# Patient Record
Sex: Male | Born: 1935 | Race: White | Hispanic: No | Marital: Married
Health system: Midwestern US, Community
[De-identification: ages and names within clinical notes are randomized; demographics above are authoritative.]

## PROBLEM LIST (undated history)

## (undated) DIAGNOSIS — R413 Other amnesia: Secondary | ICD-10-CM

## (undated) DIAGNOSIS — R55 Syncope and collapse: Secondary | ICD-10-CM

## (undated) DIAGNOSIS — E871 Hypo-osmolality and hyponatremia: Secondary | ICD-10-CM

## (undated) DIAGNOSIS — R7989 Other specified abnormal findings of blood chemistry: Secondary | ICD-10-CM

## (undated) DIAGNOSIS — F0390 Unspecified dementia without behavioral disturbance: Secondary | ICD-10-CM

## (undated) DIAGNOSIS — N189 Chronic kidney disease, unspecified: Secondary | ICD-10-CM

## (undated) DIAGNOSIS — E785 Hyperlipidemia, unspecified: Secondary | ICD-10-CM

## (undated) DIAGNOSIS — I10 Essential (primary) hypertension: Secondary | ICD-10-CM

## (undated) DIAGNOSIS — K219 Gastro-esophageal reflux disease without esophagitis: Secondary | ICD-10-CM

## (undated) DIAGNOSIS — N400 Enlarged prostate without lower urinary tract symptoms: Secondary | ICD-10-CM

## (undated) HISTORY — DX: Hyperlipidemia, unspecified: E78.5

## (undated) HISTORY — DX: Benign prostatic hyperplasia without lower urinary tract symptoms: N40.0

## (undated) HISTORY — DX: Gastro-esophageal reflux disease without esophagitis: K21.9

## (undated) HISTORY — DX: Chronic kidney disease, unspecified: N18.9

## (undated) HISTORY — PX: TRANSURETHRAL RESECTION OF PROSTATE: SHX73

## (undated) HISTORY — DX: Essential (primary) hypertension: I10

## (undated) HISTORY — DX: Unspecified dementia, unspecified severity, without behavioral disturbance, psychotic disturbance, mood disturbance, and anxiety: F03.90

---

## 2011-06-11 NOTE — Progress Notes (Signed)
Thomas Rodgers  DOB: 02/14/1935    CHIEF COMPLAINT:  Chief Complaint   Patient presents with   ??? Hypertension         SUBJECTIVE:  The patient comes in accompanied by his wife for followup of his multiple medical problems.  We had seen him a few months ago and found him to have a low platelet count and white blood cell count, referred him to the hematologist.Their feeling was the findings were nonspecific.  they plan on seeing him back again in January.  His liver function tests remain considerably elevated, the hematologist makes note of the fact that the patient had an abdominal CT scan done in July which revealed a nonspecific fatty liver.  He    He sees Dr. Elder Cyphers for GERD, underwent EGD this past spring and a stricture was found which was dilated.  Around about that time he also saw Dr. Ludwig Clarks in Dr. Vevelyn Francois absence who noted the abnormal liver function tests and recommended that this be followed up.    ROS:    He is well, remains physically active, plays golf.  He continues to drink alcohol in spite of everyone's protestations, he says he limits it to 2 glasses of wine a day.  There's been no indigestion or heartburn weight loss chills fever.  Comprehensive ROS otherwise negative.     PMFSH:    Past Surgical History   Procedure Date   ??? Hx other surgical      esophageal stricture- stretched twice   ??? Hx turp 1997     History     Social History   ??? Marital Status: Married     Spouse Name: N/A     Number of Children: N/A   ??? Years of Education: N/A     Social History Main Topics   ??? Smoking status: Former Smoker   ??? Smokeless tobacco: Not on file    Comment: quit cigs 1972   ??? Alcohol Use: 0.0 oz/week     3-4 Glasses of wine per week      03/05/2007-nonex 1 month   ??? Drug Use:    ??? Sexually Active:      Other Topics Concern   ??? Not on file     Social History Narrative   ??? No narrative on file         LABORATORY STUDIES:        PHYSICAL EXAM:     BP 124/76   Pulse 102   Ht 5' 9.4" (1.763 m)   Wt 185 lb (83.915 kg)   BMI 27.01 kg/m2    There is no peripheral edema.  Lungs are clear to auscultation and percussion and cardiac rhythm shows sinus rhythm without gallop or murmur.    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  2011/06/24    Overview       Signed 06/24/11  2:57 PM by Tyson Alias, MD     Dilated by Dr Waynette Buttery March 2012          Abnormal LFTs 790.6  06-24-2011    Overview       Signed 2011-06-24  3:04 PM by Tyson Alias, MD     Probably related to alcohol          Dementia 294.20  03/28/2011    Overview       Signed  03/28/2011 12:06 PM by Drema Pry Moultrie     Probable Alzheimers          Hypertension 401.9  03/28/2011    Overview       Signed 03/28/2011 12:07 PM by Drema Pry Moultrie     Intolerant 40mg  lisinopril          Metabolic syndrome 277.7  03/28/2011    Overview       Signed 03/28/2011 12:09 PM by Drema Pry Moultrie     Fatty liver          Hyperlipidemia 272.4  03/28/2011    Overview       Addendum 03/28/2011 12:09 PM by Drema Pry Moultrie     High HDL  Framingham risk score of 7%  PosItive Calcium Score of 469; negative stress test          Gout 274.9  03/28/2011        Pancytopenia 284.19  03/28/2011    Overview       Signed 06/11/2011  3:22 PM by Tyson Alias, MD     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 Gerarda Fraction          Peptic ulcer 533.90  03/28/2011    Overview       Signed 03/28/2011 12:11 PM by Erie Noe     Dr.Grier                  DISPOSITION:   The patient's CBC also shows a very mildly decreased hematocrit, which puts him into the class of a possible pancytopenia.  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  Dr. Ludwig Clarks had wanted him to stop drinking for a few weeks and then repeat his liver function tests.  The patient failed to do this.  On numerous times in the past I have recommended to him that he stop drinking completely, because of his dementia.  He has never stopped.    I talked to him and his wife this time about leaving alcohol off completely for 2-1/2 weeks and let us recheck his liver panel.  He says quite agreeably that he will absolutely do this and his wife shakes her head yes also.  However I have had this experience with him on multiple occasions before with the same problems as were made and failed to be kept.  I am therefore dubious that he is able to do this.However we will try.  He is to stop drinking immediately, and returned on December 20 for repeat liver panel.  We will also check a protime on him at that time.        Follow-up Disposition:  Return in about 3 months (around 09/09/2011).    Tyson Alias, MD  06/11/2011

## 2011-06-25 LAB — AMB POC PT/INR: INR POC: 1

## 2011-06-25 LAB — CBC WITH AUTOMATED DIFF

## 2011-09-03 MED ORDER — LISINOPRIL 10 MG TAB
10 mg | ORAL_TABLET | Freq: Every day | ORAL | Status: DC
Start: 2011-09-03 — End: 2012-03-11

## 2011-09-07 LAB — CBC WITH AUTOMATED DIFF

## 2011-09-13 NOTE — Progress Notes (Signed)
Tyson Alias, M.D.  [facility]  [place], [place] [address]  09/13/2011 3:30 PM        [name]  DOB: Jul 03, 1935    CHIEF COMPLAINT:  Chief Complaint   Patient presents with   ??? Hypertension     The patient comes in accompanied by his wife in followup of his multiple medical problems.  He is now seeing a nephrologist Dr. Enid Baas and his notes are reviewed.  He plans on probably doing a bone marrow biopsy in the near future if the patient's labs continued to be abnormal.    The patient feels well and has no weights.  Unfortunately he continues to drink at least 2 glasses of wine a night.  He is not having any trouble voiding, gout as been under good control, no indigestion or heartburn.He denies drinking excess fluids.    ROS:    He's had no chest pain, shortness of breath chills fever or weight loss.  He fell in the shower about 2 weeks ago, got lightheaded, injured his shoulder a little bit but it was x-rayed and that was okay and he is fine now.  He has had no more spells of lightheadedness and no more falls.  He continues to play golf and walks, does not Ride acart. [place] Kitchen  He drives, has not had any tickets or accidents.  Comprehensive ROS otherwise negative.     PMFSH:  Past Surgical History   Procedure Date   ??? Hx other surgical      esophageal stricture- stretched twice   ??? Hx turp 1997   ??? Hx colonoscopy 2009     repeat 5 years     History     Social History   ??? Marital Status: MARRIED     Spouse Name: N/A     Number of Children: N/A   ??? Years of Education: N/A     Social History Main Topics   ??? Smoking status: Former Smoker   ??? Smokeless tobacco: Not on file    Comment: quit cigs 1972   ??? Alcohol Use: 7.0 oz/week     14 Glasses of wine per week      03/05/2007-nonex 1 month   ??? Drug Use: Not on file   ??? Sexually Active: Not on file     Other Topics Concern   ??? Not on file     Social History Narrative   ??? No narrative on file       CURRENT MEDICATIONS:  Current Outpatient  Prescriptions   Medication Sig Dispense Refill   ??? lisinopril (PRINIVIL, ZESTRIL) 10 mg tablet Take 1 Tab by mouth daily.  90 Tab  3   ??? omeprazole (PRILOSEC) 20 mg capsule Take 20 mg by mouth two (2) times a day.         ??? Rivastigmine (EXELON) 9.5 mg/24 hour patch 1 Patch by TransDERmal route daily.         ??? memantine (NAMENDA) 10 mg tablet Take  by mouth two (2) times a day.         ??? folic acid-vit b6-vit b12 (FOLTX) 2.5-25-2 mg tablet Take 1 Tab by mouth daily.         ??? cyanocobalamin (VITAMIN B-12) 1,000 mcg Subl by SubLINGual route.         ??? multivitamins-minerals-lutein (CENTRUM SILVER) Tab Take  by mouth.  LABORATORY STUDIES:  Laboratory from last week shows a normal white blood cell count now with a fairly normal differential, platelet count is 134,000 which is improved and hemoglobin is 14.7, LDL cholesterol is 115.  Liver panel continues to be abnormal with an SGPT of 149, SGOT of 192 but normal alkaline phosphatase.  Sodium is now down to 131, it has been as low as 132 back 2 years ago.  Last August he was 134.      PHYSICAL EXAM:  BP 122/75   Pulse 87   Ht 5\' 9"  (1.753 m)   Wt 189 lb (85.73 kg)   BMI 27.91 kg/m2  Cardiac exam is sinus without any gallop or murmur.  There is no peripheral edema.  He is neatly dressed.    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  07/10/11    Overview       Signed 07-10-2011  2:57 PM by Tyson Alias, MD     Dilated by Dr Waynette Buttery March 2012          Abnormal LFTs 790.6  July 10, 2011    Overview       Signed 2011-07-10  3:04 PM by Tyson Alias, MD     Probably related to alcohol          Dementia 294.20  03/28/2011    Overview       Addendum 09/13/2011  3:15 PM by Tyson Alias, MD     MMSE 22/30 with abnormal clock Aug 2012, Probable Alzheimers vs Korsikoff's          Hypertension 401.9  03/28/2011    Overview       Signed 03/28/2011 12:07 PM by Drema Pry Moultrie     Intolerant  40mg  lisinopril          Metabolic syndrome 277.7  03/28/2011    Overview       Signed 03/28/2011 12:09 PM by Drema Pry Moultrie     Fatty liver          Hyperlipidemia 272.4  03/28/2011    Overview       Addendum 03/28/2011 12:09 PM by Drema Pry Moultrie     High HDL  Framingham risk score of 7%  PosItive Calcium Score of 469; negative stress test          Gout 274.9  03/28/2011        Pancytopenia 284.19  03/28/2011    Overview       Addendum 09/13/2011  3:17 PM by Tyson Alias, MD     Dr Enid Baas thinks ? 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 Gerarda Fraction          Peptic ulcer 533.90  03/28/2011    Overview       Signed 03/28/2011 12:11 PM by Erie Noe     Dr.Grier          Hyponatremia 276.1  09/13/2011              DISPOSITION:  Is not quite clear why he has mild hyponatremia.  His abnormal liver function tests for work  done in June 2011 [facility].  It was noted that he had had abnormal liver tests dating back as far back as 2001  Thought related to obesity and fatty liver.  Hepatitis A, B, and C were checked in 2011 and were negative.  He was seen by the nurse practitioner at that time arrange for made for him to come back and see Dr. Waynette Buttery, his regular gastroenterologist and I'm not sure that that ever happened.    I am going to recommend to him that he limit his fluids.  We will go ahead and make an appointment to go back and see Dr. Waynette Buttery to just make sure that Dr Waynette Buttery does not feel that he needs a liver biopsy.He will be seeing Dr. Earnstine Regal back in June    Follow-up Disposition: Not on File    Tyson Alias, MD  09/13/2011      Elements of this note have been dictated using speech recognition software.  As a result, errors of speech recognition may have occurred. Please excuse.

## 2011-11-15 ENCOUNTER — Encounter

## 2011-11-22 LAB — AMB POC PT/INR: INR POC: 1

## 2011-12-04 NOTE — Progress Notes (Signed)
Thomas Rodgers, M.D.  [facility]  [place], [place] [address]  12/04/2011 9:48 AM      Thomas Rodgers  DOB: 19-Feb-1935    CHIEF COMPLAINT:  Chief Complaint   Patient presents with   ??? Hypertension      The patient comes back in to followup on his hypertension.  He states that he did see the gastroenterologist about his liver disease but we do not have a report from them.  We will try to obtain that report.  When he last saw the hematologist, reportedly his blood counts were improved and they decided not to do a bone marrow biopsy.   He has not had any trouble from his gout.  His wife states that they have not been checking his blood pressure at home even though they have a machine.    CURRENT MEDICATIONS:  Current Outpatient Prescriptions   Medication Sig Dispense Refill   ??? lisinopril (PRINIVIL, ZESTRIL) 10 mg tablet Take 1 Tab by mouth daily.  90 Tab  3   ??? omeprazole (PRILOSEC) 20 mg capsule Take 20 mg by mouth two (2) times a day.         ??? Rivastigmine (EXELON) 9.5 mg/24 hour patch 1 Patch by TransDERmal route daily.         ??? memantine (NAMENDA) 10 mg tablet Take  by mouth two (2) times a day.         ??? folic acid-vit b6-vit b12 (FOLTX) 2.5-25-2 mg tablet Take 1 Tab by mouth daily.         ??? cyanocobalamin (VITAMIN B-12) 1,000 mcg Subl by SubLINGual route.         ??? multivitamins-minerals-lutein (CENTRUM SILVER) Tab Take  by mouth.               LABORATORY STUDIES:  Recent lab work from last week is reviewed, his sodium is still a little low but slightly improved up to 132 with otherwise normal electrolytes and a creatinine of 1.0, amylase and lipase are both normal and LDL cholesterol was 110 with an HDL of 105.    PHYSICAL EXAM:  BP 132/70   Pulse 66   Wt 186 lb (84.369 kg)  Multiple blood pressure checks are all within normal limits.  I did this in both arms manually.  There is no significant discrepancy between arms.    DIAGNOSIS:  1. Hypertension  METABOLIC PANEL, BASIC   2.  Metabolic syndrome  METABOLIC PANEL, BASIC   3. Abnormal LFTs  HEPATIC FUNCTION PANEL   4. Alcoholism     5. Hyperlipidemia  LIPID PANEL, HEPATIC FUNCTION PANEL   6. Gout  URIC ACID   7. Pancytopenia  CBC WITH AUTOMATED DIFF   8. Hyponatremia  METABOLIC PANEL, BASIC   9. Peptic ulcer     10. Chronic liver disease  AMB POC PT/INR       DISPOSITION:  I think his blood pressure is under reasonable control.  We will continue his current plan of care but try to obtain the records from the gastroenterologist.      Orders Placed This Encounter   ??? METABOLIC PANEL, BASIC   ??? CBC WITH AUTOMATED DIFF   ??? LIPID PANEL   ??? HEPATIC FUNCTION PANEL   ??? URIC ACID   ??? AMB POC PT/INR     Follow-up Disposition:  Return in about 3 months (around 03/05/2012).  Thomas Alias, MD  12/04/2011  Elements of this note have been dictated using speech recognition software.  As a result, errors of speech recognition may have occurred. Please excuse.

## 2012-03-06 LAB — METABOLIC PANEL, BASIC
BUN: 13 mg/dl (ref 9–23)
CO2: 27 mmol/L (ref 20–32)
Calcium: 10 mg/dl (ref 8.4–10.5)
Chloride: 92 mmol/L — ABNORMAL LOW (ref 97–111)
Creatinine: 1 mg/dL (ref 0.7–1.5)
GFR, estimated: 80.18
Glucose: 91 mg/dl (ref 65–100)
Potassium: 4.9 mmol/L (ref 3.5–5.5)
Sodium: 131 mmol/L — ABNORMAL LOW (ref 135–148)

## 2012-03-06 LAB — HEPATIC FUNCTION PANEL
ALT (SGPT): 67 IU/L — ABNORMAL HIGH (ref 10–35)
AST (SGOT): 90 IU/L — ABNORMAL HIGH (ref 16–40)
Albumin: 4.9 g/dl (ref 3.2–5.0)
Alk. phosphatase: 56 U/L (ref 31–130)
Bilirubin, direct: 0.5 mg/dl — ABNORMAL HIGH (ref 0.0–0.4)
Bilirubin, total: 2.3 mg/dl — ABNORMAL HIGH (ref 0.4–1.4)
Protein, total: 7.7 g/dl (ref 6.0–8.5)

## 2012-03-06 LAB — CBC WITH AUTOMATED DIFF
ABS. GRANULOCYTES: 2.3 10*3/uL (ref 2.0–7.8)
ABS. LYMPHOCYTES: 0.6 10*3/uL — ABNORMAL LOW (ref 2.0–7.8)
ABS. MONOCYTES: 0.2 (ref 0.10–1.08)
GRANULOCYTES: 74 % (ref 37.0–92.0)
HCT: 44.7 % (ref 41.0–54.0)
HGB: 15.1 g/dL (ref 14.0–18.0)
LYMPHOCYTES: 19.8 % — ABNORMAL LOW (ref 20.5–51.1)
MCH: 32.8 pg (ref 27.0–34.0)
MCHC: 33.8 g/dL (ref 31.0–36.0)
MCV: 96.9 fL (ref 80.0–100.0)
MEAN PLATELET VOLUME: 7.6 fL
MONOCYTES: 6.2 (ref 3.0–10.0)
PLATELET: 114 10*3/uL — ABNORMAL LOW (ref 140–440)
RBC: 4.61 10*3/uL (ref 4.40–6.20)
RDW: 13 %
WBC: 3.1 10*3/uL — ABNORMAL LOW (ref 4.0–11.0)

## 2012-03-06 LAB — LIPID PANEL
CHOL/HDL Ratio: 2.2 (ref 0.0–6.7)
Cholesterol, total: 249 mg/dl — ABNORMAL HIGH (ref 0–200)
HDL Cholesterol: 111 mg/dl (ref >40)
LDL, calculated: 124 mg/dL (ref 0–130)
Triglyceride: 68 mg/dl (ref 0–200)
VLDL, calculated: 14 mg/dL (ref 0–40)

## 2012-03-06 LAB — URIC ACID: Uric acid: 7.3 mg/dl — ABNORMAL HIGH (ref 2.4–7.0)

## 2012-03-06 LAB — PROTHROMBIN TIME + INR: INR: 1

## 2012-03-11 NOTE — Progress Notes (Signed)
Thomas Rodgers, M.D.  [facility]  [place], [place] [address]  03/11/2012 11:40 AM        Thomas Rodgers  DOB: [birth year]    CHIEF COMPLAINT:  Chief Complaint   Patient presents with   ??? Hypertension     3 mos rov--refil meds     The patient comes in to followup on his multiple medical problems.  He is accompanied by his wife.  Unfortunately he continues to drink.  He continues to be active, playing golf.  He drives, has had no tickets or accidents and he has no complaints.  His memory appears to have been stable.    ROS:    He has not had any chest pain shortness of breath, dizzy spells, no evidence of bleeding  Comprehensive ROS otherwise negative.     PMFSH:  Past Surgical History   Procedure Date   ??? Hx other surgical      esophageal stricture- stretched twice   ??? Hx turp 1997   ??? Hx colonoscopy 2009     repeat 5 years     History     Social History   ??? Marital Status: MARRIED     Spouse Name: N/A     Number of Children: N/A   ??? Years of Education: N/A     Social History Main Topics   ??? Smoking status: Former Smoker   ??? Smokeless tobacco: Not on file    Comment: quit cigs 1972   ??? Alcohol Use: 7.0 oz/week     14 Glasses of wine per week      03/05/2007-nonex 1 month   ??? Drug Use: Not on file   ??? Sexually Active: Not on file     Other Topics Concern   ??? Not on file     Social History Narrative   ??? No narrative on file       CURRENT MEDICATIONS:  Current Outpatient Prescriptions   Medication Sig Dispense Refill   ??? lisinopril (PRINIVIL, ZESTRIL) 10 mg tablet Take 1 Tab by mouth daily.  90 Tab  3   ??? omeprazole (PRILOSEC) 20 mg capsule Take 1 Cap by mouth two (2) times a day.  180 Cap  3   ??? rivastigmine (EXELON) 9.5 mg/24 hour patch 1 Patch by TransDERmal route daily.  90 Patch  3   ??? memantine (NAMENDA) 10 mg tablet Take 1 Tab by mouth two (2) times a day.  180 Tab  3   ??? folic acid-vit b6-vit b12 (FOLTX) 2.5-25-2 mg tablet Take 1 Tab by mouth daily.  90 Tab  3   ??? cyanocobalamin  (VITAMIN B-12) 1,000 mcg Subl by SubLINGual route.         ??? multivitamins-minerals-lutein (CENTRUM SILVER) Tab Take  by mouth.               LABORATORY STUDIES:  Laboratory data from last week is reviewed, his liver function tests are somewhat improved with a normal alkaline phosphatase and ALT and AST are 67 and 90 respectively.Uric acid is 7.3 although his gout remains asymptomatic.  Platelet count is 114,000 with a white count of 3100, these are pretty stable numbers.  Hemoglobin is normal at 15.1.  Total cholesterol 249 with an HDL of 111 and LDL of 124, sodium 131 with a potassium of 4.9 and creatinine of 1.0.        PHYSICAL EXAM:  BP 132/76   Pulse 79  Wt 184 lb (83.462 kg)  Cardiac exam is sinus without any gallop or murmur.  Simple conversation is appropriate.  He is friendly but has very poor short-term memory.    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  06/11/2011    Overview    Signed 06/11/2011  2:57 PM by Thomas Alias, MD     Dilated by Dr Waynette Buttery March 2012          Abnormal LFTs 790.6  06/11/2011    Overview    Signed 06/11/2011  3:04 PM by Thomas Alias, MD     Probably related to alcohol          Dementia 294.20  03/28/2011    Overview    Addendum 09/13/2011  3:15 PM by Thomas Alias, MD     MMSE 22/30 with abnormal clock Aug 2012, Probable Alzheimers vs Korsikoff's          Hypertension 401.9  03/28/2011    Overview    Signed 03/28/2011 12:07 PM by Drema Pry Moultrie     Intolerant 40mg  lisinopril          Metabolic syndrome 277.7  03/28/2011    Overview    Signed 03/28/2011 12:09 PM by Drema Pry Moultrie     Fatty liver          Hyperlipidemia 272.4  03/28/2011    Overview    Addendum 03/28/2011 12:09 PM by Drema Pry Moultrie     High HDL  Framingham risk score of 7%  PosItive Calcium Score of 469; negative stress test          Gout 274.9  03/28/2011        Pancytopenia 284.19  03/28/2011    Overview    Addendum 09/13/2011  3:17 PM  by Thomas Alias, MD     Dr Enid Baas thinks ? 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 Gerarda Fraction          Peptic ulcer 533.90  03/28/2011    Overview    Signed 03/28/2011 12:11 PM by Erie Noe     Dr.Grier          Chronic liver disease 571.9  12/04/2011        Hyponatremia 276.1  09/13/2011              DISPOSITION:  His gout remains asymptomatic, dementia appears to be stable, he continues to drink.  His hypertension as under reasonable control and his metabolic syndrome shows normal blood sugars at this time,, hyperlipidemia is under reasonable control, he has a low Framingham risk score because of his very high HDL, pancytopenia remains stable.    Follow-up Disposition:  Return in about 6 months (around 09/08/2012).  Orders Placed This Encounter   ??? INFLUENZA VIRUS VACCINE, FLUVIRIN VACC, 3 YRS & >, IM, MEDICARE ONLY   ??? METABOLIC PANEL, BASIC   ??? CBC WITH AUTOMATED DIFF   ??? LIPID PANEL   ??? HEPATIC FUNCTION PANEL   ??? PROTHROMBIN TIME   ??? URIC ACID   ??? lisinopril (PRINIVIL, ZESTRIL) 10 mg tablet   ???  omeprazole (PRILOSEC) 20 mg capsule   ??? rivastigmine (EXELON) 9.5 mg/24 hour patch   ??? memantine (NAMENDA) 10 mg tablet   ??? folic acid-vit b6-vit b12 (FOLTX) 2.5-25-2 mg tablet     Thomas Alias, MD  03/11/2012      Elements of this note have been dictated using speech recognition software.  As a result, errors of speech recognition may have occurred. Please excuse.

## 2012-09-23 LAB — METABOLIC PANEL, BASIC
BUN: 8 mg/dl — ABNORMAL LOW (ref 9–23)
CO2: 27 mmol/L (ref 20–32)
Calcium: 9.6 mg/dl (ref 8.4–10.5)
Chloride: 96 mmol/L — ABNORMAL LOW (ref 97–111)
Creatinine: 1 mg/dL (ref 0.7–1.5)
GFR est AA: 99.39 mL/min (ref >60.00)
GFR est non-AA: 82.01 mL/min (ref >60.00)
Glucose: 85 mg/dl (ref 65–100)
Potassium: 4.5 mmol/L (ref 3.5–5.5)
Sodium: 133 mmol/L — ABNORMAL LOW (ref 135–148)

## 2012-09-23 LAB — CBC WITH AUTOMATED DIFF
ABS. GRANULOCYTES: 1.7 10*3/uL — ABNORMAL LOW (ref 2.0–7.8)
ABS. LYMPHOCYTES: 0.8 10*3/uL — ABNORMAL LOW (ref 2.0–7.8)
ABS. MONOCYTES: 0.1 (ref 0.10–1.08)
GRANULOCYTES: 65.6 % (ref 37.0–92.0)
HCT: 43 % (ref 41.0–54.0)
HGB: 14.7 g/dL (ref 14.0–18.0)
LYMPHOCYTES: 30.6 % (ref 20.5–51.1)
MCH: 33.3 pg (ref 27.0–34.0)
MCHC: 34.2 g/dL (ref 31.0–36.0)
MCV: 97.5 fL (ref 80.0–100.0)
MEAN PLATELET VOLUME: 7.9 fL
MONOCYTES: 3.8 (ref 3.0–10.0)
PLATELET: 134 10*3/uL — ABNORMAL LOW (ref 140–440)
RBC: 4.41 10*3/uL (ref 4.40–6.20)
RDW: 12.6 %
WBC: 2.6 10*3/uL — ABNORMAL LOW (ref 4.0–11.0)

## 2012-09-23 LAB — HEPATIC FUNCTION PANEL
ALT (SGPT): 73 IU/L — ABNORMAL HIGH (ref 10–35)
AST (SGOT): 104 IU/L — ABNORMAL HIGH (ref 16–40)
Albumin: 4.5 g/dl (ref 3.2–5.0)
Alk. phosphatase: 54 U/L (ref 31–130)
Bilirubin, direct: 0.3 mg/dl (ref 0.0–0.4)
Bilirubin, total: 1.5 mg/dl — ABNORMAL HIGH (ref 0.4–1.4)
Protein, total: 7.3 g/dl (ref 6.0–8.5)

## 2012-09-23 LAB — LIPID PANEL
CHOL/HDL Ratio: 2.4 (ref 0.0–6.7)
Cholesterol, total: 241 mg/dl — ABNORMAL HIGH (ref 0–200)
HDL Cholesterol: 100 mg/dl (ref >40)
LDL, calculated: 117 mg/dL (ref 0–130)
Triglyceride: 123 mg/dl (ref 0–200)
VLDL, calculated: 25 mg/dL (ref 0–40)

## 2012-09-23 LAB — URIC ACID: Uric acid: 7 mg/dl (ref 2.4–7.0)

## 2012-09-23 LAB — PROTHROMBIN TIME + INR: INR: 1

## 2012-09-30 NOTE — Progress Notes (Signed)
Thomas Rodgers, M.D.  [facility]  [place], [place] [address]  09/30/2012 9:45 AM        Thomas Rodgers  DOB: 11-11-1934    CHIEF COMPLAINT:  Chief Complaint   Patient presents with   ??? Cholesterol Problem     rov   ??? Gout   ??? Hypertension   ??? GERD   ??? Benign Prostatic Hypertrophy     The  patient is brought in by his wife for routine followup of his multiple medical problems. He continues to be quite physically active, continues to play golf, usually walking rather than riding a cart.  He drives, has not had any tickets or accidents.  He walks without the use of any assistive devices and has had no falls.  He continues to drink 2 glasses of wine a day.  He has been seeing the hematologist on a regular basis and they also have been monitoring his platelet count and white blood cell count.  His wife thinks his dementia has gotten low but worse but there have been no behavioral problems et Karie Soda.    ROS:    He has not been having any headache or chest pain, no shortness of breath, no difficulty swallowing.  He said no dysphagia, food is not having, no indigestion or heartburn.  He's had no problems from his gout  Comprehensive ROS otherwise negative.     PMFSH:  Past Surgical History   Procedure Laterality Date   ??? Hx other surgical       esophageal stricture- stretched twice   ??? Hx turp  1997   ??? Hx colonoscopy  2009     repeat 5 years     History     Social History   ??? Marital Status: MARRIED     Spouse Name: N/A     Number of Children: N/A   ??? Years of Education: N/A     Social History Main Topics   ??? Smoking status: Former Smoker   ??? Smokeless tobacco: Not on file      Comment: quit cigs 1972   ??? Alcohol Use: 7.0 oz/week     14 Glasses of wine per week      Comment: 03/05/2007-nonex 1 month   ??? Drug Use: Not on file   ??? Sexually Active: Not on file     Other Topics Concern   ??? Not on file     Social History Narrative   ??? No narrative on file       CURRENT MEDICATIONS:  Current  Outpatient Prescriptions   Medication Sig Dispense Refill   ??? lisinopril (PRINIVIL, ZESTRIL) 10 mg tablet Take 1 Tab by mouth daily.  90 Tab  3   ??? omeprazole (PRILOSEC) 20 mg capsule Take 1 Cap by mouth two (2) times a day.  180 Cap  3   ??? rivastigmine (EXELON) 9.5 mg/24 hour patch 1 Patch by TransDERmal route daily.  90 Patch  3   ??? memantine (NAMENDA) 10 mg tablet Take 1 Tab by mouth two (2) times a day.  180 Tab  3   ??? folic acid-vit b6-vit b12 (FOLTX) 2.5-25-2 mg tablet Take 1 Tab by mouth daily.  90 Tab  3   ??? cyanocobalamin (VITAMIN B-12) 1,000 mcg Subl by SubLINGual route.         ??? multivitamins-minerals-lutein (CENTRUM SILVER) Tab Take  by mouth.  LABORATORY STUDIES:  Laboratory data from last week is reviewed, white count is 2600 with a normal differential, platelet count is 134,000.  Sodium is 133 with a creatinine of 1.0 and a potassium of 4.5, chloride of 96.  Blood sugar 95, ALT is 73, AST is 104, alkaline phosphatase is normal at 54.  LDL cholesterol 117 with a triglyceride of 123 and an HDL of 100.  Uric acid 7.0      PHYSICAL EXAM:  BP 129/70   Pulse 75   Ht 5\' 9"  (1.753 m)   Wt 180 lb (81.647 kg)   BMI 26.57 kg/m2  MMSE is performed and he scores 23 points which is one point there is any score about a year and a half ago.  He draws a slightly abnormal clock.  He is alert, neatly dressed, cooperative and pleasant.    No results found for any visits on 09/30/12.      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  06/11/2011    Overview    Signed 06/11/2011  2:57 PM by Thomas Alias, MD      Dilated by Dr Waynette Buttery March 2012          Abnormal LFTs 790.6  06/11/2011    Overview    Signed 06/11/2011  3:04 PM by Thomas Alias, MD      Probably related to alcohol          Dementia 294.20  03/28/2011    Overview    Addendum 09/30/2012  9:43 AM by Thomas Alias, MD      MMSE 22/30 with abnormal clock Aug 2012, 23/06 October 2011 with abnormal clock,  Alzheimers vs Korsikoff's          Hypertension 401.9  03/28/2011    Overview    Signed 03/28/2011 12:07 PM by Drema Pry Moultrie      Intolerant 40mg  lisinopril          Metabolic syndrome 277.7  03/28/2011    Overview    Signed 03/28/2011 12:09 PM by Drema Pry Moultrie      Fatty liver          Hyperlipidemia 272.4  03/28/2011    Overview    Addendum 03/28/2011 12:09 PM by Drema Pry Moultrie      High HDL  Framingham risk score of 7%  PosItive Calcium Score of 469; negative stress test          Gout 274.9  03/28/2011        Pancytopenia 284.19  03/28/2011    Overview    Addendum 09/13/2011  3:17 PM by Thomas Alias, MD      Dr Enid Baas thinks ? Fatty liver, I suspect hypersplenism from suspected alcoholic cirrhosis          BPH (benign prostatic hyperplasia) 600.90  03/28/2011    Overview    Signed 03/28/2011 12:10 PM by Drema Pry Moultrie      Urethral stricture; Dr. Gerarda Fraction          Peptic ulcer 533.90  03/28/2011    Overview    Signed 03/28/2011 12:11 PM by Erie Noe      Dr.Grier          Chronic liver disease 571.9  12/04/2011        Hyponatremia 276.1  09/13/2011  DISPOSITION:  His dementia appears to be fairly stable which would suggest that this dementia may be more related to alcoholism rather than to Alzheimer's but we will continue to monitor it.  Hypertension is under reasonable control and his metabolic syndrome is stable, GERD is asymptomatic, hyperlipidemia is under reasonable control and his gout remains asymptomatic.  Pancytopenia continues to be followed by the hematologist oncologist    Follow-up Disposition:  Return in about 6 months (around 04/02/2013).  Orders Placed This Encounter   ??? VITAMIN B12   ??? METABOLIC PANEL, BASIC   ??? CBC WITH AUTOMATED DIFF   ??? LIPID PANEL   ??? URIC ACID   ??? HEPATIC FUNCTION PANEL   ??? lisinopril (PRINIVIL, ZESTRIL) 10 mg tablet   ??? omeprazole (PRILOSEC) 20 mg capsule   ??? rivastigmine (EXELON) 9.5 mg/24 hour patch   ??? memantine (NAMENDA) 10 mg  tablet     Thomas Alias, MD  09/30/2012      Elements of this note have been dictated using speech recognition software.  As a result, errors of speech recognition may have occurred. Please excuse.

## 2013-03-17 LAB — CBC WITH AUTOMATED DIFF
ABS. GRANULOCYTES: 1.6 10*3/uL — ABNORMAL LOW (ref 2.0–7.8)
ABS. LYMPHOCYTES: 0.9 10*3/uL — ABNORMAL LOW (ref 2.0–7.8)
ABS. MONOCYTES: 0.2 (ref 0.10–1.08)
GRANULOCYTES: 60.9 % (ref 37.0–92.0)
HCT: 43.1 % (ref 41.0–54.0)
HGB: 14.8 g/dL (ref 14.0–18.0)
LYMPHOCYTES: 32 % (ref 20.5–51.1)
MCH: 33.2 pg (ref 27.0–34.0)
MCHC: 34.3 g/dL (ref 31.0–36.0)
MCV: 96.8 fL (ref 80.0–100.0)
MEAN PLATELET VOLUME: 7.5 fL
MONOCYTES: 7.1 (ref 3.0–10.0)
PLATELET: 152 10*3/uL (ref 140–440)
RBC: 4.45 10*3/uL (ref 4.40–6.20)
RDW: 12.4 %
WBC: 2.7 10*3/uL — ABNORMAL LOW (ref 4.0–11.0)

## 2013-03-17 LAB — LIPID PANEL
CHOL/HDL Ratio: 2.3 (ref 0.0–6.7)
Cholesterol, total: 230 mg/dl — ABNORMAL HIGH (ref 0–200)
HDL Cholesterol: 101 mg/dl (ref >40)
LDL, calculated: 106 mg/dL (ref 0–130)
Triglyceride: 113 mg/dl (ref 0–200)
VLDL, calculated: 23 mg/dL (ref 0–40)

## 2013-03-17 LAB — VITAMIN B12: Vitamin B12: 1273 pg/mL — ABNORMAL HIGH (ref 180–914)

## 2013-03-17 LAB — HEPATIC FUNCTION PANEL
ALT (SGPT): 66 IU/L — ABNORMAL HIGH (ref 10–35)
AST (SGOT): 86 IU/L — ABNORMAL HIGH (ref 16–40)
Albumin: 4.7 g/dl (ref 3.2–5.0)
Alk. phosphatase: 56 U/L (ref 31–130)
Bilirubin, direct: 0.3 mg/dl (ref 0.0–0.4)
Bilirubin, total: 1.2 mg/dl (ref 0.4–1.4)
Protein, total: 7.1 g/dl (ref 6.0–8.5)

## 2013-03-17 LAB — METABOLIC PANEL, BASIC
BUN: 10 mg/dl (ref 9–23)
CO2: 24 mmol/L (ref 20–32)
Calcium: 9.3 mg/dl (ref 8.4–10.5)
Chloride: 94 mmol/L — ABNORMAL LOW (ref 97–111)
Creatinine: 1.1 mg/dL (ref 0.7–1.5)
GFR est AA: 88.44 mL/min (ref >60.00)
GFR est non-AA: 72.97 mL/min (ref >60.00)
Glucose: 89 mg/dl (ref 65–100)
Potassium: 4.6 mmol/L (ref 3.5–5.5)
Sodium: 134 mmol/L — ABNORMAL LOW (ref 135–148)

## 2013-03-17 LAB — URIC ACID: Uric acid: 6.7 mg/dl (ref 2.4–7.0)

## 2013-03-17 NOTE — Telephone Encounter (Signed)
Message copied by Dow Adolph on Tue Mar 17, 2013  4:14 PM  ------       Message from: Tyson Alias       Created: Tue Mar 17, 2013  2:21 PM         Notify him sodium is a little lower than it was last time, make sure he is not drinking too much liquids, He should have an appointment to see me within about a week or 2 and if he does not, let me know  ------

## 2013-03-17 NOTE — Telephone Encounter (Signed)
Patient's wife Truddie Hidden notified of results and recommendations.

## 2013-03-26 NOTE — Progress Notes (Signed)
Tyson Alias, M.D.  [facility]  [place], [place] [address]  03/26/2013 11:02 AM        Thomas Rodgers  DOB: 06/29/1935    CHIEF COMPLAINT:  Chief Complaint   Patient presents with   ??? Hypertension     6 mth rov     The patient comes in accompanied by his wife to followup on his multiple medical problems.  He has been doing well. He did have some urethral bleeding this summer and was seen by his urologist Dr. Sharin Mons and was found to have a urethral stricture.  He also had a mole removed which apparently at first was thought to be melanoma but then was found to be "pre-melanoma.  This was from his back.  In going through all of that, he had to stop playing golf for a while and when he started going back to playing golf recently,  He started developing pain in his right calf whenever he would walk.  He went to one of the local doctor's care and they did an ultrasound and ruled out DVT.  He has an appointment to see the orthopedist next week.  Otherwise he's been doing well    ROS:    He's had no headache or chest pain, shortness of breath or cough or dizzy spells, no difficulty urinating or swallowing, no indigestion or heartburn and no gout  Comprehensive ROS otherwise negative.     PMFSH:  Past Surgical History   Procedure Laterality Date   ??? Hx other surgical       esophageal stricture- stretched twice   ??? Hx turp  1997   ??? Hx colonoscopy  07/21/2007     repeat 5 years     History     Social History   ??? Marital Status: MARRIED     Spouse Name: N/A     Number of Children: N/A   ??? Years of Education: N/A     Social History Main Topics   ??? Smoking status: Former Smoker   ??? Smokeless tobacco: Not on file      Comment: quit cigs 1972   ??? Alcohol Use: 7.0 oz/week     14 Glasses of wine per week      Comment: 03/05/2007-nonex 1 month   ??? Drug Use: Not on file   ??? Sexually Active: Not on file     Other Topics Concern   ??? Not on file     Social History Narrative   ??? No narrative on file        CURRENT MEDICATIONS:  Current Outpatient Prescriptions   Medication Sig Dispense Refill   ??? folic acid-vit b6-vit b12 (FOLTX) 2.5-25-2 mg tablet Take 1 tablet by mouth daily.  90 tablet  3   ??? lisinopril (PRINIVIL, ZESTRIL) 10 mg tablet Take 1 Tab by mouth daily.  90 Tab  3   ??? omeprazole (PRILOSEC) 20 mg capsule Take 1 Cap by mouth two (2) times a day.  180 Cap  3   ??? rivastigmine (EXELON) 9.5 mg/24 hour patch 1 Patch by TransDERmal route daily.  90 Patch  3   ??? memantine (NAMENDA) 10 mg tablet Take 1 Tab by mouth two (2) times a day.  180 Tab  3   ??? multivitamins-minerals-lutein (CENTRUM SILVER) Tab Take  by mouth.               LABORATORY STUDIES:  Laboratory data from last  week shows white count of 2700 which is stable, the rest of his CBC looks normal.  Sodium is 134 which is stable from a cranium 1.1 with a blood sugar of 89.  ALT and AST are 66 and 86 respectively.  LDL cholesterol 106 with an HDL of 101.  Vitamin B12 level is greater than 1200, uric acid 6.7.      PHYSICAL EXAM:  BP 114/68   Pulse 68   Wt 188 lb (85.276 kg)   BMI 27.75 kg/m2  Examination of both lower extremities show he has excellent posterior tibial pulses bilaterally, the right dorsalis pedis is excellent, the left dorsalis pedis pulse is slightly decreased but it is the right leg that has been giving him symptoms.  There is no pain on range of motion of either hip or either knee, no effusion of either knee, no calf tenderness.  Cardiac exam is sinus without gallop or murmur.    No results found for any visits on 03/26/13.      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  06/11/2011    Overview    Signed 06/11/2011  2:57 PM by Tyson Alias, MD      Dilated by Dr Waynette Buttery March  2012          Abnormal LFTs 790.6  06/11/2011    Overview    Signed 06/11/2011  3:04 PM by Tyson Alias, MD      Probably related to alcohol          Dementia 294.20  03/28/2011    Overview    Addendum 09/30/2012  9:43 AM by Tyson Alias, MD      MMSE 22/30 with abnormal clock Aug 2012, 23/06 October 2011 with abnormal clock,  Alzheimers vs Korsikoff's          Hypertension 401.9  03/28/2011    Overview    Signed 03/28/2011 12:07 PM by Drema Pry Moultrie      Intolerant 40mg  lisinopril          Metabolic syndrome 277.7  03/28/2011    Overview    Signed 03/28/2011 12:09 PM by Drema Pry Moultrie      Fatty liver          Hyperlipidemia 272.4  03/28/2011    Overview    Addendum 03/28/2011 12:09 PM by Drema Pry Moultrie      High HDL  Framingham risk score of 7%  PosItive Calcium Score of 469; negative stress test          Gout 274.9  03/28/2011        Pancytopenia 284.19  03/28/2011    Overview    Addendum 09/13/2011  3:17 PM by Tyson Alias, MD      Dr Enid Baas thinks ? Fatty liver, I suspect hypersplenism from suspected alcoholic cirrhosis          BPH (benign prostatic hyperplasia) 600.90  03/28/2011    Overview    Signed 03/28/2011 12:10 PM by Erie Noe      Urethral stricture; Dr. Gerarda Fraction  Peptic ulcer 533.90  03/28/2011    Overview    Signed 03/28/2011 12:11 PM by Erie Noe      Dr.Grier          Chronic liver disease 571.9  12/04/2011        Hyponatremia 276.1  09/13/2011              DISPOSITION:  Hypertension is under reasonable control, metabolic syndrome appears stable, GERD is asymptomatic, abnormal liver function tests are slightly improved.  He continues to drink without any change in his intake.  I'm not really sure what is causing the pain in his right lower extremity, his symptoms sound like claudication but his vascularity seems intact.  He has an appointment with the orthopedist next week and he will keep that.  His gout is asymptomatic, pancytopenia is stable and lipids are reasonable.    Follow-up Disposition:   Return in about 6 months (around 09/23/2013).  Orders Placed This Encounter   ??? CBC WITH AUTOMATED DIFF   ??? METABOLIC PANEL, BASIC   ??? HEPATIC FUNCTION PANEL   ??? LIPID PANEL   ??? folic acid-vit b6-vit b12 (FOLTX) 2.5-25-2 mg tablet     Tyson Alias, MD  03/26/2013      Elements of this note have been dictated using speech recognition software.  As a result, errors of speech recognition may have occurred. Please excuse.

## 2013-06-02 NOTE — ED Notes (Signed)
Pt alert, denies pain. Tetanus in L arm. Family at bedside.

## 2013-06-02 NOTE — ED Notes (Signed)
The patient and spouse was given their discharge instructions and  was given prescriptions.   The  patient and spouse verbalized understanding and had no additional questions. The patient was alert and was discharged via Ambulatory, without additional complaints at time of discharge.  No apparent distress noted

## 2013-06-02 NOTE — ED Notes (Signed)
Fall, hit head.  Hematoma.  Denies LOC.

## 2013-06-02 NOTE — ED Notes (Signed)
Sutures by Dr. Tiburcio Pea.

## 2013-06-02 NOTE — ED Notes (Signed)
Pt to and from CT with staff via WC

## 2013-06-03 MED ADMIN — diph,Pertuss(AC),Tet Vac-PF (BOOSTRIX) suspension 0.5 mL: INTRAMUSCULAR | @ 03:00:00 | NDC 58160084201

## 2013-06-03 MED ADMIN — oxyCODONE-acetaminophen (PERCOCET) 5-325 mg per tablet 1 tablet: ORAL | @ 04:00:00 | NDC 68084035511

## 2013-06-03 NOTE — ED Provider Notes (Signed)
Patient is a 78 y.o. male presenting with head injury. The history is provided by the patient, the spouse and a relative. History limited by: Pt has dementia.   Head Injury   The incident occurred less than 1 hour ago. He came to the ER via walk-in. The injury mechanism was a fall (Pt was putting a dinner tray away when he fell forward hitting his head on the brick fireplace). The volume of blood lost was moderate. The quality of the pain is described as throbbing. The pain is moderate. The pain has been constant since the injury. Pertinent negatives include no blurred vision, no vomiting and no weakness. He has tried applying pressure for the symptoms. The treatment provided mild relief. There was no loss of consciousness. He has been behaving normally. It is unknown when the patient last had a tetanus shot.        Past Medical History   Diagnosis Date   ??? Dementia 03/28/2011   ??? Hypertension 03/28/2011   ??? Hyperlipidemia 03/28/2011   ??? Metabolic syndrome 03/28/2011   ??? Gout 03/28/2011   ??? Leukopenia 03/28/2011   ??? BPH (benign prostatic hyperplasia) 03/28/2011   ??? Peptic ulcer 03/28/2011        Past Surgical History   Procedure Laterality Date   ??? Hx other surgical       esophageal stricture- stretched twice   ??? Hx turp  1997   ??? Hx colonoscopy  07/21/2007     repeat 5 years         Family History   Problem Relation Age of Onset   ??? Dementia Mother    ??? Migraines Mother    ??? Stroke Father         History     Social History   ??? Marital Status: MARRIED     Spouse Name: N/A     Number of Children: N/A   ??? Years of Education: N/A     Occupational History   ??? Not on file.     Social History Main Topics   ??? Smoking status: Former Smoker   ??? Smokeless tobacco: Not on file      Comment: quit cigs 1972   ??? Alcohol Use: 7.0 oz/week     14 Glasses of wine per week      Comment: 03/05/2007-nonex 1 month   ??? Drug Use: Not on file   ??? Sexually Active: Not on file     Other Topics Concern   ??? Not on file     Social History Narrative   ??? No  narrative on file                  ALLERGIES: Review of patient's allergies indicates no known allergies.      Review of Systems   Constitutional: Negative for fever.   HENT: Negative for neck pain.    Eyes: Negative for blurred vision.   Respiratory: Negative for shortness of breath.    Cardiovascular: Negative for chest pain.   Gastrointestinal: Negative for nausea, vomiting, abdominal pain and diarrhea.   Musculoskeletal: Negative for back pain.   Skin: Positive for wound.        + forehead laceration   Neurological: Positive for headaches. Negative for weakness.   All other systems reviewed and are negative.        Filed Vitals:    06/02/13 2215 06/02/13 2230 06/02/13 2245 06/02/13 2348   BP: 116/63 116/64 113/63 114/68  Pulse:    68   Temp:    98 ??F (36.7 ??C)   Resp:    16   Height:       Weight:       SpO2: 95% 95% 97% 95%            Physical Exam   Nursing note and vitals reviewed.  Constitutional: He is oriented to person, place, and time. He appears well-developed and well-nourished.   HENT:   Right Ear: External ear normal.   Left Ear: External ear normal.   Mouth/Throat: Oropharynx is clear and moist.   + 6 cm L shaped laceration on forehead   Eyes: EOM are normal. Pupils are equal, round, and reactive to light.   Neck: Normal range of motion. Neck supple.   Cardiovascular: Normal rate and regular rhythm.    Pulmonary/Chest: Breath sounds normal.   Abdominal: Soft. Bowel sounds are normal. There is no tenderness.   Musculoskeletal: Normal range of motion.   Neurological: He is alert and oriented to person, place, and time. No cranial nerve deficit.   Skin: Skin is warm.   + 6 cm L shaped laceration on right forehead No FB seen        MDM     Differential Diagnosis; Clinical Impression; Plan:     DDx: Closed Head Injury/ Laceration/ Fracture/ SDH/ ICH  Amount and/or Complexity of Data Reviewed:   Tests in the radiology section of CPT??:  Ordered and reviewed   Obtain history from someone other than the  patient:  Yes (Family)      Wound Repair  Date/Time: 06/03/2013 3:58 AM  Performed by: attendingPreparation: skin prepped with Shur-Clens  Pre-procedure re-eval: Immediately prior to the procedure, the patient was reevaluated and found suitable for the planned procedure and any planned medications.  Location: right forehead.  Wound length:2.6 - 7.5 cm  Anesthesia: local infiltration  Local anesthetic: lidocaine 1% without epinephrine  Anesthetic total: 10 ml  Foreign bodies: no foreign bodies  Irrigation solution: saline  Irrigation method: syringe  Debridement: none  Skin closure: Prolene  Subcutaneous closure: Vicryl  Number of sutures: 9  Technique: simple and interrupted  Approximation: close  Dressing: 4x4, non-adhesive packing strip and antibiotic ointment  Patient tolerance: Patient tolerated the procedure well with no immediate complications.  My total time at bedside, performing this procedure was 16-30 minutes.  Comments: Pt tolerated procedure well with no complications. Using 3-0 vicryl 3 sub cutaneous horizontal mattress sutures placed. Using 5-0 prolene, 9 simple interrupted sutures placed closing skin        On re exam, pt improved and wound with no active bleeding or large hematoma formation. Discussed with pt and family ct scan results as well as wound care/ follow up. Pt to call PMD in AM.

## 2013-06-08 NOTE — Progress Notes (Signed)
[facility]  [facility]  Danelle Earthly, M.D.  Internal Medicine  [address]  [place], [place] [address]  Office : [phone]  Fax : [phone]    CHIEF COMPLAINT  Chief Complaint   Patient presents with   ??? Wound Check       HISTORY OF PRESENT ILLNESS  Thomas Rodgers is a 78 y.o. male.  HPI Comments: Seen in ED on 11/25 after fall.  Ct Brain - for bleed    Wound Check  The history is provided by the patient and spouse. This is a new problem. The current episode started more than 2 days ago. The problem occurs constantly. The problem has been rapidly improving. Pertinent negatives include no headaches. He has tried nothing for the symptoms.     Past Medical History:  Past Medical History   Diagnosis Date   ??? Dementia 03/28/2011   ??? Hypertension 03/28/2011   ??? Hyperlipidemia 03/28/2011   ??? Metabolic syndrome 03/28/2011   ??? Gout 03/28/2011   ??? Leukopenia 03/28/2011   ??? BPH (benign prostatic hyperplasia) 03/28/2011   ??? Peptic ulcer 03/28/2011     Past Surgical History:  Past Surgical History   Procedure Laterality Date   ??? Hx other surgical       esophageal stricture- stretched twice   ??? Hx turp  1997   ??? Hx colonoscopy  07/21/2007     repeat 5 years     Allergies:   No Known Allergies  Medications:   Current Outpatient Prescriptions   Medication Sig   ??? folic acid-vit b6-vit b12 (FOLTX) 2.5-25-2 mg tablet Take 1 tablet by mouth daily.   ??? lisinopril (PRINIVIL, ZESTRIL) 10 mg tablet Take 1 Tab by mouth daily.   ??? omeprazole (PRILOSEC) 20 mg capsule Take 1 Cap by mouth two (2) times a day.   ??? rivastigmine (EXELON) 9.5 mg/24 hour patch 1 Patch by TransDERmal route daily.   ??? memantine (NAMENDA) 10 mg tablet Take 1 Tab by mouth two (2) times a day.   ??? multivitamins-minerals-lutein (CENTRUM SILVER) Tab Take  by mouth.     ??? oxyCODONE-acetaminophen (PERCOCET) 5-325 mg per tablet Take 1 tablet by mouth every four (4) hours as needed for Pain.     No current facility-administered medications for  this visit.     Social History:  History   Substance Use Topics   ??? Smoking status: Former Smoker   ??? Smokeless tobacco: Not on file      Comment: quit cigs 1972   ??? Alcohol Use: 7.0 oz/week     14 Glasses of wine per week      Comment: 03/05/2007-nonex 1 month     Family History  Family History   Problem Relation Age of Onset   ??? Dementia Mother    ??? Migraines Mother    ??? Stroke Father            Review of Systems   Neurological: Negative for dizziness, loss of consciousness and headaches.   Psychiatric/Behavioral: Positive for memory loss.     Vital Signs  BP 123/75   Pulse 77   Temp(Src) 98.2 ??F (36.8 ??C) (Oral)   Resp 16   Ht 5\' 9"  (1.753 m)   Wt 186 lb (84.369 kg)   BMI 27.45 kg/m2  Body mass index is 27.45 kg/(m^2).      Physical Exam   Constitutional: He appears well-developed and well-nourished. No distress.   HENT:  Head:       Healed laceration   Eyes: EOM are normal. Pupils are equal, round, and reactive to light. No scleral icterus.   Neurological: He is alert. He has normal strength. No cranial nerve deficit. Gait normal.   Skin: No pallor.   Psychiatric: He has a normal mood and affect.     Sutures removed in usual manner.  No wound dehiscence.  No bleeding.  Patient tolerated it well  ASSESSMENT and PLAN    ICD-9-CM    1. Laceration of head, sequela 906.0 REMOVAL OF SUTURES     Encounter Diagnoses   Name Primary?   ??? Laceration of head, sequela Yes     Follow-up Disposition:  Return if symptoms worsen or fail to improve.    __  Margrett Rud, M.D.

## 2013-07-05 LAB — METABOLIC PANEL, BASIC
Anion gap: 8 mmol/L (ref 7–16)
BUN: 12 MG/DL (ref 8–23)
CO2: 26 mmol/L (ref 21–32)
Calcium: 9.2 MG/DL (ref 8.3–10.4)
Chloride: 95 mmol/L — ABNORMAL LOW (ref 98–107)
Creatinine: 0.86 MG/DL (ref 0.8–1.5)
GFR est AA: >60 mL/min/{1.73_m2} (ref >60)
GFR est non-AA: >60 mL/min/{1.73_m2} (ref >60)
Glucose: 92 mg/dL (ref 65–100)
Potassium: 4.6 mmol/L (ref 3.5–5.1)
Sodium: 129 mmol/L — ABNORMAL LOW (ref 136–145)

## 2013-07-05 LAB — CBC WITH AUTOMATED DIFF
ABS. BASOPHILS: 0 10*3/uL (ref 0.0–0.2)
ABS. EOSINOPHILS: 0 10*3/uL (ref 0.0–0.8)
ABS. IMM. GRANS.: 0 10*3/uL (ref 0.0–0.5)
ABS. LYMPHOCYTES: 0.7 10*3/uL (ref 0.5–4.6)
ABS. MONOCYTES: 0.2 10*3/uL (ref 0.1–1.3)
ABS. NEUTROPHILS: 1.7 10*3/uL (ref 1.7–8.2)
BASOPHILS: 0 % (ref 0.0–2.0)
EOSINOPHILS: 0 % — ABNORMAL LOW (ref 0.5–7.8)
HCT: 42.1 % (ref 41.1–50.3)
HGB: 15.3 g/dL (ref 13.6–17.2)
IMMATURE GRANULOCYTES: 0.4 % (ref 0.0–5.0)
LYMPHOCYTES: 26 % (ref 13–44)
MCH: 33.4 PG — ABNORMAL HIGH (ref 26.1–32.9)
MCHC: 36.3 g/dL — ABNORMAL HIGH (ref 31.4–35.0)
MCV: 91.9 FL (ref 79.6–97.8)
MONOCYTES: 9 % (ref 4.0–12.0)
MPV: 10 FL — ABNORMAL LOW (ref 10.8–14.1)
NEUTROPHILS: 65 % (ref 43–78)
PLATELET: 98 10*3/uL — ABNORMAL LOW (ref 150–450)
RBC: 4.58 M/uL (ref 4.23–5.67)
RDW: 12.5 % (ref 11.9–14.6)
WBC: 2.6 10*3/uL — ABNORMAL LOW (ref 4.3–11.1)

## 2013-07-05 LAB — TROPONIN I: Troponin-I, Qt.: <0.02 NG/ML — ABNORMAL LOW (ref 0.02–0.05)

## 2013-07-05 MED ORDER — SODIUM CHLORIDE 0.9% BOLUS IV
0.9 % | Freq: Once | INTRAVENOUS | Status: AC
Start: 2013-07-05 — End: 2013-07-05
  Administered 2013-07-05: 22:00:00 via INTRAVENOUS

## 2013-07-05 MED ORDER — SODIUM CHLORIDE 0.9 % IJ SYRG
INTRAMUSCULAR | Status: DC | PRN
Start: 2013-07-05 — End: 2013-07-05

## 2013-07-05 MED ORDER — SODIUM CHLORIDE 0.9 % IJ SYRG
Freq: Three times a day (TID) | INTRAMUSCULAR | Status: DC
Start: 2013-07-05 — End: 2013-07-05

## 2013-07-05 NOTE — ED Notes (Signed)
Patient remains with app 400cc left to be administered.

## 2013-07-05 NOTE — ED Notes (Signed)
Patient advises that he started to feel bad at church then broke out in a sweat. Advises that they stood up in the aisle to leave and passed out, advises several people assisted in catching him.

## 2013-07-05 NOTE — ED Provider Notes (Signed)
HPI Comments: 69 yowm reportedly had a syncopal episode in church today. He reports he felt like something "wasn't right" but can't give details. Denies CP, SOB and headache. Has not had recent N/V/F but has had cough and hiccups for 2 days. Wife reports he appeared to have a brief sz. sxs lasted approx 5 mins. No associated cyanosis or apnea. Pt now has no complaints.     Patient is a 78 y.o. male presenting with syncope. The history is provided by the patient and the spouse.   Syncope          Past Medical History   Diagnosis Date   ??? Dementia 03/28/2011   ??? Hypertension 03/28/2011   ??? Hyperlipidemia 03/28/2011   ??? Metabolic syndrome 03/28/2011   ??? Gout 03/28/2011   ??? Leukopenia 03/28/2011   ??? BPH (benign prostatic hyperplasia) 03/28/2011   ??? Peptic ulcer 03/28/2011        Past Surgical History   Procedure Laterality Date   ??? Hx other surgical       esophageal stricture- stretched twice   ??? Hx turp  1997   ??? Hx colonoscopy  07/21/2007     repeat 5 years         Family History   Problem Relation Age of Onset   ??? Dementia Mother    ??? Migraines Mother    ??? Stroke Father         History     Social History   ??? Marital Status: MARRIED     Spouse Name: N/A     Number of Children: N/A   ??? Years of Education: N/A     Occupational History   ??? Not on file.     Social History Main Topics   ??? Smoking status: Former Smoker   ??? Smokeless tobacco: Not on file      Comment: quit cigs 1972   ??? Alcohol Use: 7.0 oz/week     14 Glasses of wine per week      Comment: 03/05/2007-nonex 1 month   ??? Drug Use: Not on file   ??? Sexually Active: Not on file     Other Topics Concern   ??? Not on file     Social History Narrative   ??? No narrative on file                  ALLERGIES: Review of patient's allergies indicates no known allergies.      Review of Systems   Cardiovascular: Positive for syncope.   All other systems reviewed and are negative.        Filed Vitals:    07/05/13 1548 07/05/13 1600 07/05/13 1630 07/05/13 1640   BP: 146/76 144/73 155/78     Pulse: 76 81 73 76   Temp:       Resp:    20   Height:       Weight:       SpO2: 96% 96% 97% 95%            Physical Exam   Nursing note and vitals reviewed.  Constitutional: He is oriented to person, place, and time. He appears well-developed and well-nourished. No distress.   HENT:   Head: Normocephalic and atraumatic.   Mouth/Throat: Oropharynx is clear and moist.   Eyes: Conjunctivae and EOM are normal. Pupils are equal, round, and reactive to light.   Neck: Normal range of motion. Neck supple.   Cardiovascular: Normal rate, regular  rhythm and normal heart sounds.    Pulmonary/Chest: Effort normal and breath sounds normal. He has no wheezes.   Abdominal: Soft. He exhibits no distension. There is no tenderness.   Musculoskeletal: Normal range of motion. He exhibits no edema.   Neurological: He is alert and oriented to person, place, and time. He has normal reflexes. No cranial nerve deficit. He exhibits normal muscle tone. Coordination normal.   Skin: Skin is warm and dry. No rash noted.   Psychiatric: He has a normal mood and affect.        MDM     Differential Diagnosis; Clinical Impression; Plan:     Pt found to have mild hyponatremia. Also wbc and plt low but this appears chronic. EKG no acute abn. Orthostatics showed mild elevation in rate but no sxs or BP changes. Pt has been hydrated with NS. Etiology for syncope not clear at this time but may have been vasovagal episode. Have offered admission, but wife and pt would prefer to be DC'd home to f/u with PMD. Pt has remained sxs free for several hours in ED.  Amount and/or Complexity of Data Reviewed:   Clinical lab tests:  Ordered and reviewed  Tests in the radiology section of CPT??:  Ordered and reviewed  Tests in the medicine section of the CPT??:  Ordered and reviewed   Discuss the patient with another provider:  Yes   Independant visualization of image, tracing, or specimen:  Yes  Risk of Significant Complications, Morbidity, and/or Mortality:    Presenting problems:  Moderate  Diagnostic procedures:  Moderate  Management options:  Moderate  Progress:   Patient progress:  Stable      Procedures

## 2013-07-05 NOTE — ED Notes (Signed)
100cc of NS left to be administered.

## 2013-07-05 NOTE — ED Notes (Signed)
Reports had syncopal episode at church.  Reports cough since christmas.

## 2013-07-05 NOTE — ED Notes (Signed)
I have reviewed discharge instructions with the patient.  The patient verbalized understanding. Patient is ambulatory within room preparing to get dressed. Patient is in no acute distress. Patient's wife has discharge instructions in hand at time of departure.

## 2013-07-06 LAB — EKG, 12 LEAD, INITIAL
Atrial Rate: 72 {beats}/min
Calculated P Axis: 60 degrees
Calculated R Axis: -11 degrees
Calculated T Axis: 55 degrees
P-R Interval: 168 ms
Q-T Interval: 372 ms
QRS Duration: 86 ms
QTC Calculation (Bezet): 407 ms
Ventricular Rate: 72 {beats}/min

## 2013-07-08 NOTE — Progress Notes (Signed)
[facility]  [facility]  Danelle Earthly, M.D.  Internal Medicine  [address]  [place], [place] [address]  Office : [phone]  Fax : [phone]    CHIEF COMPLAINT  Chief Complaint   Patient presents with   ??? Loss of Consciousness       HISTORY OF PRESENT ILLNESS  Thomas Rodgers is a 78 y.o. male.  Loss of Consciousness   The history is provided by the patient and spouse. This is a new problem. The current episode started more than 2 days ago. The problem occurs rarely. The problem has been resolved. He lost consciousness for a period of 1 to 5 minutes. The problem is associated with standing up. Pertinent negatives include no chest pain, no palpitations, no nausea, no vomiting, no focal weakness and no seizures. Associated symptoms comments: Coughing and hiccupping preceded episode.  Had been standing for a long time in [place]. He has tried nothing for the symptoms.     Past Medical History:  Past Medical History   Diagnosis Date   ??? Dementia 03/28/2011   ??? Hypertension 03/28/2011   ??? Hyperlipidemia 03/28/2011   ??? Metabolic syndrome 03/28/2011   ??? Gout 03/28/2011   ??? Leukopenia 03/28/2011   ??? BPH (benign prostatic hyperplasia) 03/28/2011   ??? Peptic ulcer 03/28/2011     Past Surgical History:  Past Surgical History   Procedure Laterality Date   ??? Hx other surgical       esophageal stricture- stretched twice   ??? Hx turp  1997   ??? Hx colonoscopy  07/21/2007     repeat 5 years     Allergies:   No Known Allergies  Medications:   Current Outpatient Prescriptions   Medication Sig   ??? oxyCODONE-acetaminophen (PERCOCET) 5-325 mg per tablet Take 1 tablet by mouth every four (4) hours as needed for Pain.   ??? folic acid-vit b6-vit b12 (FOLTX) 2.5-25-2 mg tablet Take 1 tablet by mouth daily.   ??? lisinopril (PRINIVIL, ZESTRIL) 10 mg tablet Take 1 Tab by mouth daily.   ??? omeprazole (PRILOSEC) 20 mg capsule Take 1 Cap by mouth two (2) times a day.   ??? rivastigmine (EXELON) 9.5 mg/24 hour patch 1 Patch by  TransDERmal route daily.   ??? memantine (NAMENDA) 10 mg tablet Take 1 Tab by mouth two (2) times a day.   ??? multivitamins-minerals-lutein (CENTRUM SILVER) Tab Take  by mouth.       No current facility-administered medications for this visit.     Social History:  History   Substance Use Topics   ??? Smoking status: Former Smoker   ??? Smokeless tobacco: Not on file      Comment: quit cigs 1972   ??? Alcohol Use: 7.0 oz/week     14 Glasses of wine per week      Comment: 03/05/2007-nonex 1 month     Family History  Family History   Problem Relation Age of Onset   ??? Dementia Mother    ??? Migraines Mother    ??? Stroke Father        Review of Systems   Cardiovascular: Negative for chest pain and palpitations.   Gastrointestinal: Negative for nausea and vomiting.   Neurological: Positive for loss of consciousness. Negative for focal weakness and seizures.   Psychiatric/Behavioral: Positive for memory loss.     Vital Signs  BP 116/71   Pulse 64   Temp(Src) 98.7 ??F (37.1 ??C) (Oral)  Resp 14   Ht 5\' 9"  (1.753 m)   Wt 185 lb (83.915 kg)   BMI 27.31 kg/m2  Body mass index is 27.31 kg/(m^2).       Physical Exam   Constitutional: He appears well-developed and well-nourished. No distress.   HENT:   Head: Normocephalic and atraumatic.   Eyes: EOM are normal. Pupils are equal, round, and reactive to light. No scleral icterus.   Neck: No JVD present.   Cardiovascular: Normal rate, regular rhythm and normal heart sounds.    Pulmonary/Chest: Effort normal and breath sounds normal.   Neurological: He is alert. He has normal strength. No cranial nerve deficit. Gait normal.   Skin: No pallor.   Psychiatric: He has a normal mood and affect. His behavior is normal.     Admission on 07/05/2013, Discharged on 07/05/2013   Component Date Value Range Status   ??? Sodium 07/05/2013 129* 136 - 145 mmol/L Final   ??? Potassium 07/05/2013 4.6  3.5 - 5.1 mmol/L Final   ??? Chloride 07/05/2013 95* 98 - 107 mmol/L Final   ??? CO2 07/05/2013 26  21 - 32 mmol/L Final    ??? Anion gap 07/05/2013 8  7 - 16 mmol/L Final   ??? Glucose 07/05/2013 92  65 - 100 mg/dL Final    Comment: 47 - 60 mg/dl Consistent with, but not fully diagnostic of hypoglycemia.                           101 - 125 mg/dl Impaired fasting glucose/consistent with pre-diabetes mellitus                           > 126 mg/dl Fasting glucose consistent with overt diabetes mellitus   ??? BUN 07/05/2013 12  8 - 23 MG/DL Final   ??? Creatinine 07/05/2013 0.86  0.8 - 1.5 MG/DL Final   ??? GFR est AA 07/05/2013 >60  >60 ml/min/1.47m2 Final   ??? GFR est non-AA 07/05/2013 >60  >60 ml/min/1.51m2 Final    Comment: (NOTE)                           Estimated GFR is calculated using the Modification of Diet in Renal                            Disease (MDRD) Study equation, reported for both African Americans                            (GFRAA) and non-African Americans (GFRNA), and normalized to 1.81m2                            body surface area. The physician must decide which value applies to                            the patient. The MDRD study equation should only be used in                            individuals age 67 or older. It has not been validated for the  following: pregnant women, patients with serious comorbid conditions,                            or on certain medications, or persons with extremes of body size,                            muscle mass, or nutritional status.   ??? Calcium 07/05/2013 9.2  8.3 - 10.4 MG/DL Final   ??? WBC 16/04/9603 2.6* 4.3 - 11.1 K/uL Final   ??? RBC 07/05/2013 4.58  4.23 - 5.67 M/uL Final   ??? HGB 07/05/2013 15.3  13.6 - 17.2 g/dL Final   ??? HCT 54/03/8118 42.1  41.1 - 50.3 % Final   ??? MCV 07/05/2013 91.9  79.6 - 97.8 FL Final   ??? MCH 07/05/2013 33.4* 26.1 - 32.9 PG Final   ??? MCHC 07/05/2013 36.3* 31.4 - 35.0 g/dL Final   ??? RDW 14/78/2956 12.5  11.9 - 14.6 % Final   ??? PLATELET 07/05/2013 98* 150 - 450 K/uL Final   ??? MPV 07/05/2013 10.0* 10.8 - 14.1 FL Final   ??? DF  07/05/2013 AUTOMATED   Final   ??? NEUTROPHILS 07/05/2013 65  43 - 78 % Final   ??? LYMPHOCYTES 07/05/2013 26  13 - 44 % Final   ??? MONOCYTES 07/05/2013 9  4.0 - 12.0 % Final   ??? EOSINOPHILS 07/05/2013 0* 0.5 - 7.8 % Final   ??? BASOPHILS 07/05/2013 0  0.0 - 2.0 % Final   ??? IMMATURE GRANULOCYTES 07/05/2013 0.4  0.0 - 5.0 % Final   ??? ABS. NEUTROPHILS 07/05/2013 1.7  1.7 - 8.2 K/UL Final   ??? ABS. LYMPHOCYTES 07/05/2013 0.7  0.5 - 4.6 K/UL Final   ??? ABS. MONOCYTES 07/05/2013 0.2  0.1 - 1.3 K/UL Final   ??? ABS. EOSINOPHILS 07/05/2013 0.0  0.0 - 0.8 K/UL Final   ??? ABS. BASOPHILS 07/05/2013 0.0  0.0 - 0.2 K/UL Final   ??? ABS. IMM. GRANS. 07/05/2013 0.0  0.0 - 0.5 K/UL Final   ??? Troponin-I, Qt. 07/05/2013 <0.02* 0.02 - 0.05 NG/ML Final    Comment: "A cutoff of >0.60 NG/ML is suggested as                           being consistent with the WHO                           criteria for AMI.                                                       Values ranging from 0.06 to 0.59 represent an                           indeterminant/grey zone for injury due to                           myocarditis, ischemia, trauma, etc.  Clinical correlation is necessary to determine                           the significance of the presence of                           Troponin cTnI.                           Sequential testing is recommended.                           Cardiac Troponin-I has a relatively long                           half-life and may be present well after                           the CK MB has returned to baseline.                                                       Values ranging from 0.00 to 0.05 NG/ML                           represents 97.5 percentile ranking of                           individuals from a test population that                           demonstrates a healthy clinical picture."   ??? Ventricular Rate 07/05/2013 72   Final   ??? Atrial Rate 07/05/2013 72   Final   ??? P-R Interval 07/05/2013 168    Final   ??? QRS Duration 07/05/2013 86   Final   ??? Q-T Interval 07/05/2013 372   Final   ??? QTC Calculation (Bezet) 07/05/2013 407   Final   ??? Calculated P Axis 07/05/2013 60   Final   ??? Calculated R Axis 07/05/2013 -11   Final   ??? Calculated T Axis 07/05/2013 55   Final   ??? Diagnosis 07/05/2013    Final                    Value:!! AGE AND GENDER SPECIFIC ECG ANALYSIS !!                          Normal sinus rhythm                          Normal ECG                          No previous ECGs available                          Confirmed by BITTRICK  MD (UC), JON M ([id number]) on 07/06/2013  5:39:41 PM         ASSESSMENT and PLAN    ICD-9-CM    1. Syncope 780.2 CT HEART W/O CONT WITH CALCIUM   2. Hypertension 401.9 CT HEART W/O CONT WITH CALCIUM   3. Hyperlipidemia 272.4 CT HEART W/O CONT WITH CALCIUM     Encounter Diagnoses   Name Primary?   ??? Syncope Yes   ??? Hypertension    ??? Hyperlipidemia      Anne was seen today for loss of consciousness.    Diagnoses and associated orders for this visit:    Syncope  - [NON-BSHSI] CT HEART W/O CONT WITH CALCIUM; Future    Hypertension  - [NON-BSHSI] CT HEART W/O CONT WITH CALCIUM; Future    Hyperlipidemia  - [NON-BSHSI] CT HEART W/O CONT WITH CALCIUM; Future        Follow-up Disposition:  Return if symptoms worsen or fail to improve.  Vasovagal episode per history, but he has multiple cardiac risk factors so they agreed to a CT Calcium score to see if further evaluation was warranted  __  Margrett Rud, M.D.

## 2013-07-28 ENCOUNTER — Encounter

## 2013-09-17 LAB — LIPID PANEL
CHOL/HDL Ratio: 2.1 (ref 0.0–6.7)
Cholesterol, total: 222 mg/dl — ABNORMAL HIGH (ref 0–200)
HDL Cholesterol: 107 mg/dl (ref >40)
LDL, calculated: 98 mg/dL (ref 0–130)
Triglyceride: 84 mg/dl (ref 0–200)
VLDL, calculated: 17 mg/dL (ref 0–40)

## 2013-09-17 LAB — CBC WITH AUTOMATED DIFF
ABS. GRANULOCYTES: 1.6 10*3/uL — ABNORMAL LOW (ref 2.0–7.8)
ABS. LYMPHOCYTES: 0.7 10*3/uL — ABNORMAL LOW (ref 2.0–7.8)
ABS. MONOCYTES: 0.2 (ref 0.10–1.08)
GRANULOCYTES: 65.7 % (ref 37.0–92.0)
HCT: 42.3 % (ref 41.0–54.0)
HGB: 13.4 g/dL — ABNORMAL LOW (ref 14.0–18.0)
LYMPHOCYTES: 27.9 % (ref 20.5–51.1)
MCH: 31.5 pg (ref 27.0–34.0)
MCHC: 31.8 g/dL (ref 31.0–36.0)
MCV: 99.1 fL (ref 80.0–100.0)
MEAN PLATELET VOLUME: 6.9 fL
MONOCYTES: 6.4 (ref 3.0–10.0)
PLATELET: 132 10*3/uL — ABNORMAL LOW (ref 140–440)
RBC: 4.27 10*3/uL — ABNORMAL LOW (ref 4.40–6.20)
RDW: 12.6 %
WBC: 2.4 10*3/uL — ABNORMAL LOW (ref 4.0–11.0)

## 2013-09-17 LAB — HEPATIC FUNCTION PANEL
ALT (SGPT): 106 IU/L — ABNORMAL HIGH (ref 10–35)
AST (SGOT): 141 IU/L — ABNORMAL HIGH (ref 16–40)
Albumin: 4.4 g/dl (ref 3.2–5.0)
Alk. phosphatase: 56 U/L (ref 31–130)
Bilirubin, direct: 0.2 mg/dl (ref 0.0–0.4)
Bilirubin, total: 1 mg/dl (ref 0.4–1.4)
Protein, total: 6.9 g/dl (ref 6.0–8.5)

## 2013-09-17 LAB — METABOLIC PANEL, BASIC
BUN: 10 mg/dl (ref 9–23)
CO2: 25 mmol/L (ref 20–32)
Calcium: 9.6 mg/dl (ref 8.4–10.5)
Chloride: 95 mmol/L — ABNORMAL LOW (ref 97–111)
Creatinine: 0.9 mg/dL (ref 0.7–1.5)
GFR est AA: 100.35 mL/min (ref >60.00)
GFR est non-AA: 82.8 mL/min (ref >60.00)
Glucose: 80 mg/dl (ref 65–100)
Potassium: 4.6 mmol/L (ref 3.5–5.5)
Sodium: 132 mmol/L — ABNORMAL LOW (ref 135–148)

## 2013-09-21 NOTE — Progress Notes (Signed)
Quick Note:    Will ask Dr Rush BarerVry to evaluate further rise in liver enzymes. 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

## 2013-09-29 MED ORDER — OMEPRAZOLE 20 MG CAP, DELAYED RELEASE
20 mg | ORAL_CAPSULE | Freq: Two times a day (BID) | ORAL | Status: DC
Start: 2013-09-29 — End: 2014-09-09

## 2013-09-29 MED ORDER — MEMANTINE 10 MG TAB
10 mg | ORAL_TABLET | Freq: Two times a day (BID) | ORAL | Status: DC
Start: 2013-09-29 — End: 2013-10-29

## 2013-09-29 NOTE — Progress Notes (Signed)
Tyson AliasJohn L. Samson Ralph, M.D.  [facility]  [facility]  [place], [place] [phone]  09/29/2013 4:05 PM        Richrd HumblesGeorge F Klugh  DOB: 04/16/1935    CHIEF COMPLAINT:  Chief Complaint   Patient presents with   ??? Hypertension     The patient comes in accompanied by his wife for routine followup of his multiple medical problems.  In going over his labs, I see that he had a calcium scan done in January.  Apparently he had an episode of syncope and collapse, he was pale and diaphoretic and completely passed out at church, he was seen and evaluated by a med tech who is in the congregation, he was sent to the emergency room where further evaluation was done and he was sent home with followup.  This followup was done with Dr. Hansel StarlingBudelmann who ordered a coronary calcium scan which was abnormal but did not reveal any obvious obstruction.  That was about 3 months ago.  He has had no further episodes of diaphoresis, dizziness, syncope or near syncope, chest pain or tightness or heaviness in his chest, indigestion or heartburn and has felt well.  He continues to play golf.  He didn't play much during February because of the bad weather.  He continues to drink with his wife, the 2 of them consuming about a bottle of wine a day.  He denies any trouble swallowing.    ROS:    He has not been having any headaches, there's been no difficulty urinating or having bowel movements, no chills or fever  Or weight loss  Comprehensive ROS otherwise negative.     PMFSH:  Past Surgical History   Procedure Laterality Date   ??? Hx other surgical       esophageal stricture- stretched twice   ??? Hx turp  1997   ??? Hx colonoscopy  07/21/2007     repeat 5 years     History     Social History   ??? Marital Status: MARRIED     Spouse Name: N/A     Number of Children: N/A   ??? Years of Education: N/A     Social History Main Topics   ??? Smoking status: Former Smoker   ??? Smokeless tobacco: Not on file      Comment: quit cigs 1972   ??? Alcohol Use: 7.0 oz/week      14 Glasses of wine per week      Comment: 03/05/2007-nonex 1 month   ??? Drug Use: Not on file   ??? Sexual Activity: Not on file     Other Topics Concern   ??? Not on file     Social History Narrative       CURRENT MEDICATIONS:  Current Outpatient Prescriptions   Medication Sig Dispense Refill   ??? memantine (NAMENDA) 10 mg tablet Take 1 Tab by mouth two (2) times a day.  180 Tab  3   ??? omeprazole (PRILOSEC) 20 mg capsule Take 1 Cap by mouth two (2) times a day.  180 Cap  3   ??? folic acid-vit b6-vit b12 (FOLTX) 2.5-25-2 mg tablet Take 1 tablet by mouth daily.  90 tablet  3   ??? lisinopril (PRINIVIL, ZESTRIL) 10 mg tablet Take 1 Tab by mouth daily.  90 Tab  3   ??? rivastigmine (EXELON) 9.5 mg/24 hour patch 1 Patch by TransDERmal route daily.  90 Patch  3   ??? multivitamins-minerals-lutein (CENTRUM SILVER)  Tab Take  by mouth.         ??? oxyCODONE-acetaminophen (PERCOCET) 5-325 mg per tablet Take 1 tablet by mouth every four (4) hours as needed for Pain.  20 tablet  0         LABORATORY STUDIES:  Labs done in March 12 show a hemoglobin of 13.4, white count 2400 with a normal differential and platelet count 132,000.  These are stable numbers.  Sodium is 132 which is fairly stable, the rest of his electrolytes look okay.  Randy and 0.9 with a blood sugar of 80, LDL cholesterol is 98 with an HDL of 107.  ALT and AST remained mildly elevated at 106 and 141 respectively.    PHYSICAL EXAM:  BP 131/76    Pulse 78    Ht 5' 9.5" (1.765 m)    Wt 186 lb (84.369 kg)    BMI 27.08 kg/m2     Cardiac exam is sinus without gallop or murmur.  There are no carotid bruits.    No results found for any visits on 09/29/13.      DIAGNOSIS:  Problem List Date Reviewed: 09/29/2013        ICD-9-CM Class Noted     Pain of right lower leg 729.5  03/26/2013    Overview     Signed 03/26/2013 10:56 AM by Tyson Alias, MD     Onset Aug 2014, excellent pulses, seems related to walking           Alcoholism 303.90  09/13/2011         GERD with stricture 530.81  06/11/2011     Overview     Signed 06/11/2011  2:57 PM by Tyson Alias, MD     Dilated by Dr Waynette Buttery March 2012           Abnormal LFTs 790.6  06/11/2011    Overview     Signed 06/11/2011  3:04 PM by Tyson Alias, MD     Probably related to alcohol           Dementia 294.20  03/28/2011    Overview     Addendum 09/30/2012  9:43 AM by Tyson Alias, MD     MMSE 22/30 with abnormal clock Aug 2012, 23/06 October 2011 with abnormal clock,  Alzheimers vs Korsikoff's           Hypertension 401.9  03/28/2011    Overview     Signed 03/28/2011 12:07 PM by Drema Pry Moultrie     Intolerant 40mg  lisinopril           Metabolic syndrome 277.7  03/28/2011    Overview     Signed 03/28/2011 12:09 PM by Drema Pry Moultrie     Fatty liver           Hyperlipidemia 272.4  03/28/2011    Overview     Addendum 03/28/2011 12:09 PM by Drema Pry Moultrie     High HDL  Framingham risk score of 7%  PosItive Calcium Score of 469; negative stress test           Gout 274.9  03/28/2011         Pancytopenia 284.19  03/28/2011    Overview     Addendum 09/13/2011  3:17 PM by Tyson Alias, MD     Dr Enid Baas thinks ? Fatty liver, I suspect hypersplenism from suspected alcoholic cirrhosis           BPH (benign prostatic hyperplasia) 600.90  03/28/2011  Overview     Signed 03/28/2011 12:10 PM by Erie Noe     Urethral stricture; Dr. Gerarda Fraction           Peptic ulcer 533.90  03/28/2011    Overview     Signed 03/28/2011 12:11 PM by Erie Noe     Dr.Grier           Chronic liver disease 571.9  12/04/2011         Hyponatremia 276.1  09/13/2011              DISPOSITION:  He had a syncopal episode about 3 months ago associated with diaphoresis and complete loss of consciousness, he had had a cups for 10 days prior to that and after the episode had no further hiccups.  He saw Dr. Hansel Starling and had a positive coronary calcium scan.  No obstruction was seen however.  He has had virtually no symptoms since then.  No changes have been made in his medications.  He did have a mildly abnormal carotid ultrasound  several years ago.  I think we need to proceed with further workup, he is not eligible for statin therapy because of his abnormal liver function tests and excess alcohol use, however I'm going to get an echo and a treadmill exercise stress test.  If either of those 2 are abnormal then we will send him for cardiology referral.  If both are normal then we will continue stressing weight loss and diet and exercise which is discussed with him today.    His dementia seems recently stable, hypertension is under reasonable control, metabolic syndrome is stable, GERD is asymptomatic, abnormal liver function tests appear to be stable and hyperlipidemia is under reasonable control.    Follow-up Disposition:  Return in about 3 months (around 12/30/2013).  Orders Placed This Encounter   ??? DUPLEX CAROTID BILATERAL   ??? CBC   ??? BMP   ??? Lipid Panel   ??? Liver Panel   ??? Uric Acid   ??? REFERRAL TO CARDIOLOGY   ??? memantine (NAMENDA) 10 mg tablet   ??? omeprazole (PRILOSEC) 20 mg capsule     Tyson Alias, MD  09/29/2013      Elements of this note have been dictated using speech recognition software.  As a result, errors of speech recognition may have occurred. Please excuse.

## 2013-10-06 NOTE — Progress Notes (Signed)
Quick Note:    Results reviewed. No urgent action required. Will hold for PCP review at his/her return.  ______

## 2013-10-13 NOTE — Telephone Encounter (Signed)
-----   Message from Tyson AliasJohn L Vry, MD sent at 10/13/2013  9:11 AM EDT -----  Notify him that carotid ultrasound shows some plaque but no significant obstruction, repeat study in 2 years

## 2013-10-13 NOTE — Telephone Encounter (Signed)
Report given to wife

## 2013-10-13 NOTE — Telephone Encounter (Signed)
Report below given to wife.

## 2013-10-13 NOTE — Telephone Encounter (Signed)
-----   Message from Tyson AliasJohn L Vry, MD sent at 10/13/2013  1:50 PM EDT -----  Notify his wife that his has some plaque still in his carotid arteries but they do not demonstrate significant obstruction, he should have repeat ultrasound in 2 years

## 2013-10-29 ENCOUNTER — Encounter

## 2013-10-29 MED ORDER — LISINOPRIL 10 MG TAB
10 mg | ORAL_TABLET | Freq: Every day | ORAL | Status: DC
Start: 2013-10-29 — End: 2014-01-21

## 2013-10-29 MED ORDER — MEMANTINE 10 MG TAB
10 mg | ORAL_TABLET | Freq: Two times a day (BID) | ORAL | Status: DC
Start: 2013-10-29 — End: 2014-02-04

## 2013-12-22 MED ORDER — RIVASTIGMINE 9.5 MG/24 HOUR TRANSDERM 24 HR PATCH
9.5 mg/24 hour | MEDICATED_PATCH | Freq: Every day | TRANSDERMAL | Status: DC
Start: 2013-12-22 — End: 2014-05-03

## 2014-01-05 LAB — CBC WITH AUTOMATED DIFF
ABS. GRANULOCYTES: 1.4 10*3/uL — ABNORMAL LOW (ref 2.0–7.8)
ABS. LYMPHOCYTES: 0.7 10*3/uL — ABNORMAL LOW (ref 2.0–7.8)
ABS. MONOCYTES: 0.1 (ref 0.10–1.08)
GRANULOCYTES: 62.1 % (ref 37.0–92.0)
HCT: 43 % (ref 41.0–54.0)
HGB: 14.7 g/dL (ref 14.0–18.0)
LYMPHOCYTES: 31.5 % (ref 20.5–51.1)
MCH: 33.4 pg (ref 27.0–34.0)
MCHC: 34.1 g/dL (ref 31.0–36.0)
MCV: 97.9 fL (ref 80.0–100.0)
MEAN PLATELET VOLUME: 7.8 fL
MONOCYTES: 6.4 (ref 3.0–10.0)
PLATELET: 142 10*3/uL (ref 140–440)
RBC: 4.39 10*3/uL — ABNORMAL LOW (ref 4.40–6.20)
RDW: 12.7 %
WBC: 2.3 10*3/uL — CL (ref 4.0–11.0)

## 2014-01-05 LAB — HEPATIC FUNCTION PANEL
ALT (SGPT): 76 IU/L — ABNORMAL HIGH (ref 10–35)
AST (SGOT): 90 IU/L — ABNORMAL HIGH (ref 16–40)
Albumin: 4 g/dl (ref 3.2–5.0)
Alk. phosphatase: 48 U/L (ref 31–130)
Bilirubin, direct: 0.2 mg/dl (ref 0.0–0.4)
Bilirubin, total: 1.1 mg/dl (ref 0.4–1.4)
Protein, total: 6.4 g/dl (ref 6.0–8.5)

## 2014-01-05 LAB — METABOLIC PANEL, BASIC
BUN: 12 mg/dl (ref 9–23)
CO2: 27 mmol/L (ref 20–32)
Calcium: 9.5 mg/dl (ref 8.4–10.5)
Chloride: 96 mmol/L — ABNORMAL LOW (ref 97–111)
Creatinine: 1 mg/dL (ref 0.7–1.5)
GFR est AA: 97.86 mL/min (ref >60.00)
GFR est non-AA: 80.74 mL/min (ref >60.00)
Glucose: 77 mg/dl (ref 65–100)
Potassium: 4.7 mmol/L (ref 3.5–5.5)
Sodium: 133 mmol/L — ABNORMAL LOW (ref 135–148)

## 2014-01-05 LAB — LIPID PANEL
CHOL/HDL Ratio: 2.8 (ref 0.0–6.7)
Cholesterol, total: 212 mg/dl — ABNORMAL HIGH (ref 0–200)
HDL Cholesterol: 77 mg/dl (ref >40)
LDL, calculated: 114 mg/dL (ref 0–130)
Triglyceride: 106 mg/dl (ref 0–200)
VLDL, calculated: 21 mg/dL (ref 0–40)

## 2014-01-05 LAB — URIC ACID: Uric acid: 7.9 mg/dl — ABNORMAL HIGH (ref 2.4–7.0)

## 2014-01-12 NOTE — Progress Notes (Signed)
Tyson AliasJohn L. Allison Silva, M.D.  [facility]  [facility]  [place], [place] [id number]  01/12/2014 10:59 AM        Thomas HumblesGeorge F Lovecchio  DOB: [birth year]    CHIEF COMPLAINT:  Chief Complaint   Patient presents with   ??? Hypertension     3mrov     The patient comes in for routine followup of his multiple medical problems.  He continues to be very physically active, plays golf 3 times a week and walks, does not ride a cart.  He walks without the use of any assistive devices, has had no falls.  He continues to drive, has not had any tickets or accidents.  He continues to be followed by the hematologist and his counts have been stable.  They don't plan to see him back for 6 months.  He has no complaints.    He had a spell in church several months ago which was a syncopal episode.  We sent him for a workup which included treadmill stress test which was normal, echocardiogram which showed some mild to moderate aortic insufficiency and some diastolic dysfunction, and carotid ultrasound which showed less than 50% stenosis bilaterally.  He has had no further spells.    ROS:    He denies any headache or chest pain, no shortness of breath or cough.  He's had no dysphagia or indigestion as long as he takes Prilosec.  He's had no episodes of gout.  Comprehensive ROS otherwise negative.     PMFSH:  Past Surgical History   Procedure Laterality Date   ??? Hx other surgical       esophageal stricture- stretched twice   ??? Hx turp  1997   ??? Hx colonoscopy  07/21/2007     repeat 5 years     History     Social History   ??? Marital Status: MARRIED     Spouse Name: N/A     Number of Children: N/A   ??? Years of Education: N/A     Social History Main Topics   ??? Smoking status: Former Smoker   ??? Smokeless tobacco: Not on file      Comment: quit cigs 1972   ??? Alcohol Use: 7.0 oz/week     14 Glasses of wine per week      Comment: 03/05/2007-nonex 1 month   ??? Drug Use: Not on file   ??? Sexual Activity: Not on file     Other Topics Concern   ??? Not on file      Social History Narrative       CURRENT MEDICATIONS:  Current Outpatient Prescriptions   Medication Sig Dispense Refill   ??? rivastigmine (EXELON) 9.5 mg/24 hr patch 1 Patch by TransDERmal route daily. 90 Patch 3   ??? memantine (NAMENDA) 10 mg tablet Take 1 Tab by mouth two (2) times a day. 180 Tab 0   ??? lisinopril (PRINIVIL, ZESTRIL) 10 mg tablet Take 1 Tab by mouth daily. 90 Tab 0   ??? omeprazole (PRILOSEC) 20 mg capsule Take 1 Cap by mouth two (2) times a day. 180 Cap 3   ??? folic acid-vit b6-vit b12 (FOLTX) 2.5-25-2 mg tablet Take 1 tablet by mouth daily. 90 tablet 3   ??? multivitamins-minerals-lutein (CENTRUM SILVER) Tab Take  by mouth.             LABORATORY STUDIES:  Laboratory data from June 30 is reviewed, white count is 2300 but his differential is normal  and his hemoglobin is 14.7.  Sodium 133 which is stable, fasting blood sugar 77, creatinine 1.0 with normal electrolytes other than his sodium.  Liver panel is fairly normal  Except for ALT and AST of 76 and 90 respectively which is improved from his previous values LDL cholesterol is 114 with a triglyceride of 106.  Uric acid is 7.9.    PHYSICAL EXAM:  BP 152/78 mmHg   Pulse 65   Ht 5' 9.5" (1.765 m)   Wt 183 lb (83.008 kg)   BMI 26.65 kg/m2  Cardiac exam is sinus without gallop or murmur.    No results found for any visits on 01/12/14.      DIAGNOSIS:  Problem List Date Reviewed: 01/12/2014        ICD-9-CM Class Noted    Alcoholism (HCC) 303.90  09/13/2011        GERD with stricture 530.81  530.3  06/11/2011    Overview    Signed 06/11/2011  2:57 PM by Tyson AliasJohn L Gearald Stonebraker, MD     Dilated by Dr Waynette ButteryGreer March 2012          Abnormal LFTs 790.6  06/11/2011    Overview    Signed 06/11/2011  3:04 PM by Tyson AliasJohn L Marv Alfrey, MD     Probably related to alcohol          Dementia 294.20  03/28/2011    Overview    Addendum 09/30/2012  9:43 AM by Tyson AliasJohn L Ahja Martello, MD     MMSE 22/30 with abnormal clock Aug 2012, 23/06 October 2011 with abnormal clock,  Alzheimers vs Korsikoff's           Hypertension 401.9  03/28/2011    Overview    Signed 03/28/2011 12:07 PM by Drema PryShayla Moultrie     Intolerant 40mg  lisinopril          Diastolic dysfunction 429.9  01/12/2014    Overview    Signed 01/12/2014 10:52 AM by Tyson AliasJohn L Heman Que, MD     Mild on echo spring 2015          Aortic regurgitation 424.1  01/12/2014    Overview    Signed 01/12/2014 10:52 AM by Tyson AliasJohn L Uziel Covault, MD     Mild to Moderate on echo spring 2015          Hyperlipidemia 272.4  03/28/2011    Overview    Addendum 03/28/2011 12:09 PM by Drema PryShayla Moultrie     High HDL  Framingham risk score of 7%  PosItive Calcium Score of 469; negative stress test          Metabolic syndrome 277.7  03/28/2011    Overview    Signed 03/28/2011 12:09 PM by Drema PryShayla Moultrie     Fatty liver          Gout 274.9  03/28/2011        Pancytopenia (HCC) 284.19  03/28/2011    Overview    Addendum 09/13/2011  3:17 PM by Tyson AliasJohn L Adrieana Fennelly, MD     Dr Enid BaasBolemon thinks ? Fatty liver, I suspect hypersplenism from suspected alcoholic cirrhosis          BPH (benign prostatic hyperplasia) 600.00  03/28/2011    Overview    Signed 03/28/2011 12:10 PM by Drema PryShayla Moultrie     Urethral stricture; Dr. Gerarda FractionFlanagan          Peptic ulcer 533.90  03/28/2011    Overview    Signed 03/28/2011 12:11 PM by Erie NoeShayla Moultrie  Dr.Grier          Chronic liver disease 571.9  12/04/2011        Hyponatremia 276.1  09/13/2011              DISPOSITION:  It is not quite clear what caused the spell that he had in church several months ago but he's had no further spells and his workup was negative, see above.    Hypertension is under excellent control, GERD is asymptomatic, dementia is stable and probably related to his alcoholism.  Abnormal liver function studies are stable, leukopenia is stable, gout is asymptomatic.    Follow-up Disposition:  Return in about 6 months (around 07/15/2014).  No orders of the defined types were placed in this encounter.     Tyson Alias, MD  01/12/2014      Elements of this note have been dictated using speech recognition  software.  As a result, errors of speech recognition may have occurred. Please excuse.

## 2014-01-21 MED ORDER — FOLIC ACID-VIT B6-VIT B12 2.5 MG-25 MG-2 MG TAB
ORAL_TABLET | Freq: Every day | ORAL | Status: DC
Start: 2014-01-21 — End: 2014-09-09

## 2014-01-21 MED ORDER — LISINOPRIL 10 MG TAB
10 mg | ORAL_TABLET | Freq: Every day | ORAL | Status: DC
Start: 2014-01-21 — End: 2014-05-04

## 2014-02-04 ENCOUNTER — Encounter

## 2014-02-04 MED ORDER — MEMANTINE 10 MG TAB
10 mg | ORAL_TABLET | Freq: Two times a day (BID) | ORAL | Status: DC
Start: 2014-02-04 — End: 2015-01-25

## 2014-05-03 ENCOUNTER — Ambulatory Visit: Admit: 2014-05-03 | Discharge: 2014-05-03 | Payer: MEDICARE | Attending: Family Medicine | Primary: Family Medicine

## 2014-05-03 DIAGNOSIS — F0390 Unspecified dementia without behavioral disturbance: Secondary | ICD-10-CM

## 2014-05-03 LAB — URINALYSIS W/ RFLX MICROSCOPIC
Bilirubin: NEGATIVE mg/dL
Blood: NEGATIVE ERY/UL
Glucose: NEGATIVE mg/dL
Ketone: NEGATIVE mg/dL
Leukocyte Esterase: NEGATIVE
Nitrites: NEGATIVE
Protein: NEGATIVE mg/dL
Specific gravity: 1.01 (ref 1.000–1.030)
Urobilinogen: 0.2 E.U./dL (ref 0.0–1.0)
pH (UA): 7 (ref 5.0–8.0)

## 2014-05-03 LAB — METABOLIC PANEL, BASIC
BUN: 7 mg/dl — ABNORMAL LOW (ref 9–23)
CO2: 28 mmol/L (ref 20–32)
Calcium: 9.3 mg/dl (ref 8.4–10.5)
Chloride: 91 mmol/L — ABNORMAL LOW (ref 97–111)
Creatinine: 0.9 mg/dL (ref 0.7–1.5)
GFR est AA: 109.54 mL/min (ref >60.00)
GFR est non-AA: 90.38 mL/min (ref >60.00)
Glucose: 82 mg/dl (ref 65–100)
Potassium: 4.5 mmol/L (ref 3.5–5.5)
Sodium: 127 mmol/L — ABNORMAL LOW (ref 135–148)

## 2014-05-03 LAB — CBC WITH AUTOMATED DIFF
ABS. GRANULOCYTES: 1.7 10*3/uL — ABNORMAL LOW (ref 2.0–7.8)
ABS. LYMPHOCYTES: 0.8 10*3/uL — ABNORMAL LOW (ref 2.0–7.8)
ABS. MONOCYTES: 0.2 (ref 0.10–1.08)
GRANULOCYTES: 61.7 % (ref 37.0–92.0)
HCT: 43.4 % (ref 41.0–54.0)
HGB: 14.5 g/dL (ref 14.0–18.0)
LYMPHOCYTES: 30.1 % (ref 20.5–51.1)
MCH: 32.8 pg (ref 27.0–34.0)
MCHC: 33.5 g/dL (ref 31.0–36.0)
MCV: 97.9 fL (ref 80.0–100.0)
MEAN PLATELET VOLUME: 7.7 fL
MONOCYTES: 8.2 (ref 3.0–10.0)
PLATELET: 148 10*3/uL (ref 140–440)
RBC: 4.44 10*3/uL (ref 4.40–6.20)
RDW: 12.5 %
WBC: 2.7 10*3/uL — ABNORMAL LOW (ref 4.0–11.0)

## 2014-05-03 LAB — HEPATIC FUNCTION PANEL
ALT (SGPT): 70 IU/L — ABNORMAL HIGH (ref 10–35)
AST (SGOT): 98 IU/L — ABNORMAL HIGH (ref 16–40)
Albumin: 4.2 g/dl (ref 3.2–5.0)
Alk. phosphatase: 52 U/L (ref 31–130)
Bilirubin, direct: 0.2 mg/dl (ref 0.0–0.4)
Bilirubin, total: 1.2 mg/dl (ref 0.4–1.4)
Protein, total: 6.8 g/dl (ref 6.0–8.5)

## 2014-05-03 LAB — VITAMIN B12: Vitamin B12: >1500 pg/mL — ABNORMAL HIGH (ref 180–914)

## 2014-05-03 LAB — TSH 3RD GENERATION: TSH: 0.6 u[IU]/mL (ref 0.47–5.01)

## 2014-05-03 MED ORDER — RIVASTIGMINE 13.3 MG/24 HOUR TRANSDERMAL PATCH
13.3 mg/24 hour | MEDICATED_PATCH | Freq: Every day | TRANSDERMAL | Status: DC
Start: 2014-05-03 — End: 2015-01-25

## 2014-05-03 NOTE — Progress Notes (Signed)
Thomas Rodgers, M.D.  [facility]  [address] [place], [place] [address]  05/03/2014 9:43 AM        Thomas Rodgers  DOB: 1934/10/10    CHIEF COMPLAINT:  Chief Complaint   Patient presents with   ??? Memory Loss     wife would like to discuss memory issues        His wife brings in the patient in today as a work in because she is concerned that he is "slipping".  There was an episode that she left him in the church with arrangements for someone to  bring him home, he forgot about the ride, went to the parking lot And couldn't find his car so he thought someone had stolen his car, a couple who knew him drove him home but when he got there his wife was not there and he became quite frightened not knowing where she was; The next day he did not remember going to church or how he got home and  he was still a little agitated and did not play golf.  He continues to drink 3 glasses of wine a day but when we have estimated the amount of wine that he and his wife are considering under previous circumstances, it is generally been more than that.      ROS:    The patient has had no headaches, no episodes of urinary or fecal incontinence.  He has had no falls and has no difficulty walking.  He continues to drive, has not had any tickets or accidents, his wife continues to ride with him with him driving and she has no problem with that.  Comprehensive ROS otherwise negative.     PMFSH:  Past Surgical History   Procedure Laterality Date   ??? Hx other surgical       esophageal stricture- stretched twice   ??? Hx turp  1997   ??? Hx colonoscopy  07/21/2007     repeat 5 years     History     Social History   ??? Marital Status: MARRIED     Spouse Name: N/A     Number of Children: N/A   ??? Years of Education: N/A     Social History Main Topics   ??? Smoking status: Former Smoker   ??? Smokeless tobacco: Not on file      Comment: quit cigs 1972   ??? Alcohol Use: 7.0 oz/week     14 Glasses of wine per week       Comment: 03/05/2007-nonex 1 month   ??? Drug Use: Not on file   ??? Sexual Activity: Not on file     Other Topics Concern   ??? Not on file     Social History Narrative       CURRENT MEDICATIONS:  Current Outpatient Prescriptions   Medication Sig Dispense Refill   ??? rivastigmine (EXELON) 13.3 mg/24 hour patch 1 Patch by TransDERmal route daily. 90 Patch 3   ??? memantine (NAMENDA) 10 mg tablet Take 1 Tab by mouth two (2) times a day. 180 Tab 3   ??? lisinopril (PRINIVIL, ZESTRIL) 10 mg tablet Take 1 Tab by mouth daily. 90 Tab 0   ??? folic acid-vit b6-vit b12 (FOLTX) 2.5-25-2 mg tablet Take 1 Tab by mouth daily. 90 Tab 3   ??? omeprazole (PRILOSEC) 20 mg capsule Take 1 Cap by mouth two (2) times a day. 180 Cap 3   ??? multivitamins-minerals-lutein (CENTRUM  SILVER) Tab Take  by mouth.             LABORATORY STUDIES:  Labs will be ordered today    PHYSICAL EXAM:  BP 112/70 mmHg   Pulse 73   Ht 5' 9.5" (1.765 m)   Wt 184 lb (83.462 kg)   BMI 26.79 kg/m2  MMSE is repeated today and he scores 19/30  and continues to draw an abnormal clock.    No results found for any visits on 05/03/14.      DIAGNOSIS:  Problem List  Date Reviewed: 01/12/2014        Codes Class Noted    Alcoholism (HCC) ICD-10-CM:  F10.20  ICD-9-CM:  303.90  09/13/2011        GERD with stricture ICD-10-CM:  K21.9  K22.2  ICD-9-CM:  530.81  530.3  06/11/2011    Overview    Signed 06/11/2011  2:57 PM by Thomas AliasJohn L Annaleigha Woo, MD     Dilated by Dr Waynette ButteryGreer March 2012          Abnormal LFTs ICD-10-CM:  R79.89  ICD-9-CM:  790.6  06/11/2011    Overview    Signed 06/11/2011  3:04 PM by Thomas AliasJohn L Lexus Barletta, MD     Probably related to alcohol          Dementia ICD-10-CM:  F03.90  ICD-9-CM:  294.20  03/28/2011    Overview    Addendum 05/03/2014  9:47 AM by Thomas AliasJohn L Miko Sirico, MD     MMSE 19/30 with abnormal clock 05/03/2014, 23/30 with abnormal clock March 2013,  22/30 with abnormal clock Aug 2012, Alzheimers vs Korsikoff's, long standing hx fairly heavy drinking of wine with wife          Hypertension ICD-10-CM:   I10  ICD-9-CM:  401.9  03/28/2011    Overview    Signed 03/28/2011 12:07 PM by Drema PryShayla Moultrie     Intolerant 40mg  lisinopril          Diastolic dysfunction ICD-10-CM:  I51.9  ICD-9-CM:  429.9  01/12/2014    Overview    Signed 01/12/2014 10:52 AM by Thomas AliasJohn L Seham Gardenhire, MD     Mild on echo spring 2015          Aortic regurgitation ICD-10-CM:  I35.1  ICD-9-CM:  424.1  01/12/2014    Overview    Signed 01/12/2014 10:52 AM by Thomas AliasJohn L Cyla Haluska, MD     Mild to Moderate on echo spring 2015          Hyperlipidemia ICD-10-CM:  E78.5  ICD-9-CM:  272.4  03/28/2011    Overview    Addendum 03/28/2011 12:09 PM by Drema PryShayla Moultrie     High HDL  Framingham risk score of 7%  PosItive Calcium Score of 469; negative stress test          Metabolic syndrome ICD-10-CM:  Z61.09E88.81  ICD-9-CM:  277.7  03/28/2011    Overview    Signed 03/28/2011 12:09 PM by Erie NoeShayla Moultrie     Fatty liver          Gout ICD-10-CM:  M10.9  ICD-9-CM:  274.9  03/28/2011        Pancytopenia (HCC) ICD-10-CM:  U04.54061.818  ICD-9-CM:  284.19  03/28/2011    Overview    Addendum 09/13/2011  3:17 PM by Thomas AliasJohn L Kelson Queenan, MD     Dr Enid BaasBolemon thinks ? Fatty liver, I suspect hypersplenism from suspected alcoholic cirrhosis          BPH (benign prostatic  hyperplasia) ICD-10-CM:  N40.0  ICD-9-CM:  600.00  03/28/2011    Overview    Signed 03/28/2011 12:10 PM by Drema PryShayla Moultrie     Urethral stricture; Dr. Gerarda FractionFlanagan          Peptic ulcer ICD-10-CM:  K27.9  ICD-9-CM:  533.90  03/28/2011    Overview    Signed 03/28/2011 12:11 PM by Erie NoeShayla Moultrie     Dr.Grier          Senile dementia, uncomplicated ICD-10-CM:  F03.90  ICD-9-CM:  290.0  05/03/2014        Chronic liver disease ICD-10-CM:  K76.9  ICD-9-CM:  571.9  12/04/2011        Hyponatremia ICD-10-CM:  E87.1  ICD-9-CM:  276.1  09/13/2011              DISPOSITION:  The patient has had some progression of his underlying dementia.  His wife is concerned as to "is this progressing to Alzheimer's from mild cognitive impairment?".  The wife has already talked about the case with Dr. Mosetta PigeonJohn  Taylor as they know him personally.  Dr. Ladona Ridgelaylor has recommended that we increase his Exelon patch to 13.5.  We will go ahead and increase his Exelon patch and refer him to neurology for further evaluation.  Because of his other metabolic abnormalities including pancytopenia and chronically elevated abnormal liver function tests, I continue to believe that his dementia is more related to alcoholism that it is Alzheimer's but we will see what the neurologist says. For the past several years that the patient has had cognitive impairment, this has been my working diagnosis and I have continually pleaded with the wife and patient to cut alcohol completely out but no real decrease in their consumption has ever occurred.    Follow-up Disposition:  Return if symptoms worsen or fail to improve.  Orders Placed This Encounter   ??? INFLUENZA VIRUS VACCINE, PRESERVATIVE FREE SYRINGE, 3 YRS AND OLDER   ??? TSH   ??? B12   ??? BMP   ??? CBC   ??? Liver Panel   ??? REFERRAL TO NEUROLOGY   ??? rivastigmine (EXELON) 13.3 mg/24 hour patch     Thomas AliasJOHN L Tomara Youngberg, MD  05/03/2014      Elements of this note have been dictated using speech recognition software.  As a result, errors of speech recognition may have occurred. Please excuse.

## 2014-05-04 ENCOUNTER — Encounter

## 2014-05-04 MED ORDER — LISINOPRIL 10 MG TAB
10 mg | ORAL_TABLET | Freq: Every day | ORAL | Status: AC
Start: 2014-05-04 — End: ?

## 2014-05-04 NOTE — Telephone Encounter (Signed)
-----   Message from Thomas AliasJohn L Vry, MD sent at 05/04/2014  8:28 AM EDT -----  Notify his wife that his sodium is low in his blood, this may be due to his blood pressure medication lisinopril.  His blood pressure was low when I saw him yesterday.  I would recommend that he discontinue lisinopril altogether, see me in 3 or 4 weeks and come in a week early for nonfasting BMP to recheck his serum sodium. Another cause for low sodium can be when someone drinks too much water, if he is drinking a lot of water, then he should back off on that.

## 2014-05-31 ENCOUNTER — Encounter: Admit: 2014-05-31 | Discharge: 2014-08-09 | Payer: MEDICARE | Primary: Family Medicine

## 2014-05-31 DIAGNOSIS — E871 Hypo-osmolality and hyponatremia: Secondary | ICD-10-CM

## 2014-05-31 LAB — METABOLIC PANEL, BASIC
BUN: 14 mg/dl (ref 9–23)
CO2: 28 mmol/L (ref 20–32)
Calcium: 10 mg/dl (ref 8.4–10.5)
Chloride: 93 mmol/L — ABNORMAL LOW (ref 97–111)
Creatinine: 1.2 mg/dL (ref 0.7–1.5)
GFR est AA: 78.58 mL/min (ref >60.00)
GFR est non-AA: 64.83 mL/min (ref >60.00)
Glucose: 103 mg/dl — ABNORMAL HIGH (ref 65–100)
Potassium: 4.2 mmol/L (ref 3.5–5.5)
Sodium: 132 mmol/L — ABNORMAL LOW (ref 135–148)

## 2014-06-09 ENCOUNTER — Ambulatory Visit: Admit: 2014-06-09 | Discharge: 2014-06-09 | Payer: MEDICARE | Attending: Family Medicine | Primary: Family Medicine

## 2014-06-09 DIAGNOSIS — E871 Hypo-osmolality and hyponatremia: Secondary | ICD-10-CM

## 2014-06-09 NOTE — Progress Notes (Signed)
Thomas Rodgers. Safina Huard, M.D.  [facility]  [facility]  [place], [place] [id number]  06/09/2014 11:47 AM      Thomas HumblesGeorge F Alper  DOB: September 19, 1934    CHIEF COMPLAINT:  No chief complaint on file.     Today the patient is brought back by his wife for followup of his dementia.  At last visit we had increased his Exelon patch to the higher dose.  His wife has not noticed any difference since we did that.  He has not had any side effects from the medication.  His wife tells me that [facility] and that [facility] diagnosed him as having "dementia".  He continues to concern him wind daily.  He has an appointment with a neurologist coming up later this week.    CURRENT MEDICATIONS:  Current Outpatient Prescriptions   Medication Sig Dispense Refill   ??? lisinopril (PRINIVIL, ZESTRIL) 10 mg tablet Take 1 Tab by mouth daily. 90 Tab 0   ??? rivastigmine (EXELON) 13.3 mg/24 hour patch 1 Patch by TransDERmal route daily. 90 Patch 3   ??? memantine (NAMENDA) 10 mg tablet Take 1 Tab by mouth two (2) times a day. 180 Tab 3   ??? folic acid-vit b6-vit b12 (FOLTX) 2.5-25-2 mg tablet Take 1 Tab by mouth daily. 90 Tab 3   ??? omeprazole (PRILOSEC) 20 mg capsule Take 1 Cap by mouth two (2) times a day. 180 Cap 3   ??? multivitamins-minerals-lutein (CENTRUM SILVER) Tab Take  by mouth.             LABORATORY STUDIES:  Laboratory data from last week is reviewed, serum sodium is improved at 132, previously was 127.  Fasting blood sugar 103, calcium 10.0, creatinine 1.2.    PHYSICAL EXAM:  BP 147/79 mmHg   Pulse 74   Wt 181 lb (82.101 kg)      TEST RESULTS:  No results found for any visits on 06/09/14.      DIAGNOSIS:    ICD-10-CM ICD-9-CM    1. Hyponatremia E87.1 276.1 METABOLIC PANEL, BASIC   2. Dementia, without behavioral disturbance F03.90 294.20    3. Essential hypertension I10 401.9        DISPOSITION:  The patient's hyponatremia has improved, we will continue to monitor that.   He will be seeing the neurologist later this week.  [facility] seeing [facility]  He seems to be doing reasonably well, continues to be quite physically active [place] cetera.  His wife tells me that they are [place] close to one of her children, this is a full range facility which has available dementia care [place].  They will be moving there sometime this summer.    Orders Placed This Encounter   ??? BMP     Follow-up Disposition:  Return in about 3 months (around 09/08/2014).  Thomas Rodgers Thomas Mellette, MD  06/09/2014      Elements of this note have been dictated using speech recognition software.  As a result, errors of speech recognition may have occurred. Please excuse.

## 2014-07-15 ENCOUNTER — Encounter: Primary: Family Medicine

## 2014-07-15 DIAGNOSIS — C44622 Squamous cell carcinoma of skin of right upper limb, including shoulder: Secondary | ICD-10-CM | POA: Diagnosis not present

## 2014-07-20 ENCOUNTER — Encounter: Attending: Family Medicine | Primary: Family Medicine

## 2014-07-22 DIAGNOSIS — L905 Scar conditions and fibrosis of skin: Secondary | ICD-10-CM | POA: Diagnosis not present

## 2014-07-22 DIAGNOSIS — C44622 Squamous cell carcinoma of skin of right upper limb, including shoulder: Secondary | ICD-10-CM | POA: Diagnosis not present

## 2014-08-13 DIAGNOSIS — N99114 Postprocedural urethral stricture, male, unspecified: Secondary | ICD-10-CM | POA: Diagnosis not present

## 2014-08-13 DIAGNOSIS — N400 Enlarged prostate without lower urinary tract symptoms: Secondary | ICD-10-CM | POA: Diagnosis not present

## 2014-08-13 DIAGNOSIS — R3989 Other symptoms and signs involving the genitourinary system: Secondary | ICD-10-CM | POA: Diagnosis not present

## 2014-08-17 DIAGNOSIS — F0390 Unspecified dementia without behavioral disturbance: Secondary | ICD-10-CM | POA: Diagnosis not present

## 2014-09-02 ENCOUNTER — Encounter: Admit: 2014-09-02 | Discharge: 2014-09-02 | Payer: MEDICARE | Primary: Family Medicine

## 2014-09-02 DIAGNOSIS — E871 Hypo-osmolality and hyponatremia: Secondary | ICD-10-CM

## 2014-09-02 LAB — METABOLIC PANEL, BASIC
BUN: 10 mg/dl (ref 9–23)
CO2: 27 mmol/L (ref 20–32)
Calcium: 9 mg/dl (ref 8.4–10.5)
Chloride: 99 mmol/L (ref 97–111)
Creatinine: 1 mg/dL (ref 0.7–1.5)
GFR est AA: 95.4 mL/min (ref >60.00)
GFR est non-AA: 78.71 mL/min (ref >60.00)
Glucose: 83 mg/dl (ref 65–100)
Potassium: 4.4 mmol/L (ref 3.5–5.5)
Sodium: 134 mmol/L — ABNORMAL LOW (ref 135–148)

## 2014-09-09 ENCOUNTER — Ambulatory Visit: Admit: 2014-09-09 | Discharge: 2014-09-09 | Payer: MEDICARE | Attending: Family Medicine | Primary: Family Medicine

## 2014-09-09 DIAGNOSIS — E871 Hypo-osmolality and hyponatremia: Secondary | ICD-10-CM

## 2014-09-09 MED ORDER — AMLODIPINE 2.5 MG TAB
2.5 mg | ORAL_TABLET | Freq: Every day | ORAL | Status: DC
Start: 2014-09-09 — End: 2015-01-25

## 2014-09-09 MED ORDER — FOLIC ACID-VIT B6-VIT B12 2.5 MG-25 MG-2 MG TAB
ORAL_TABLET | Freq: Every day | ORAL | Status: AC
Start: 2014-09-09 — End: ?

## 2014-09-09 MED ORDER — OMEPRAZOLE 20 MG CAP, DELAYED RELEASE
20 mg | ORAL_CAPSULE | Freq: Two times a day (BID) | ORAL | Status: AC
Start: 2014-09-09 — End: ?

## 2014-09-09 NOTE — Progress Notes (Signed)
Tyson Alias, M.D.  [facility]  [place], [place] [address]  09/09/2014 11:58 AM        Thomas Rodgers  DOB: 09-22-1934    CHIEF COMPLAINT:  Chief Complaint   Patient presents with   ??? Hypertension         The patient comes in for followup of his multiple medical problems.  He continues to live at home with his wife, still plays golf although he does not play as much as he used to.  He walks without the use of any assistive devices and has had no falls.  He still drives, has not had any tickets or accidents.  There have been no behavioral issues associated with his dementia.  His wife states that they are no longer going to [facility].  He and his wife continue to see him a total of one bottle of wine a day.  The patient denies any difficulty swallowing, has not had any indigestion or heartburn.  He has not had any dizzy spells or chest pains, last echo of his heart was last year about a year ago.  He's not having any difficulty voiding and continues to see Dr. Gerarda Fraction.  He has had no attacks of gout in years.  He's had no shortness of breath.  He and his wife still planning on moving to [place] in August and they have already sold her house.    ROS:    He denies any syncopal episodes, chills, fever, change in bowel habits, incontinence  Comprehensive ROS otherwise negative.     PMFSH:  Past Surgical History   Procedure Laterality Date   ??? Hx other surgical       esophageal stricture- stretched twice   ??? Hx turp  1997   ??? Hx colonoscopy  07/21/2007     repeat 5 years     History     Social History   ??? Marital Status: MARRIED     Spouse Name: N/A   ??? Number of Children: N/A   ??? Years of Education: N/A     Social History Main Topics   ??? Smoking status: Former Smoker   ??? Smokeless tobacco: Not on file      Comment: quit cigs 1972   ??? Alcohol Use: 7.0 oz/week     14 Glasses of wine per week      Comment: 03/05/2007-nonex 1 month    ??? Drug Use: Not on file   ??? Sexual Activity: Not on file     Other Topics Concern   ??? None     Social History Narrative       CURRENT MEDICATIONS:  Current Outpatient Prescriptions   Medication Sig Dispense Refill   ??? folic acid-vit b6-vit b12 (FOLTX) 2.5-25-2 mg tablet Take 1 Tab by mouth daily. 90 Tab 3   ??? omeprazole (PRILOSEC) 20 mg capsule Take 1 Cap by mouth two (2) times a day. 180 Cap 3   ??? amLODIPine (NORVASC) 2.5 mg tablet Take 1 Tab by mouth daily. 30 Tab 11   ??? rivastigmine (EXELON) 13.3 mg/24 hour patch 1 Patch by TransDERmal route daily. 90 Patch 3   ??? memantine (NAMENDA) 10 mg tablet Take 1 Tab by mouth two (2) times a day. 180 Tab 3   ??? multivitamins-minerals-lutein (CENTRUM SILVER) Tab Take  by mouth.       ??? lisinopril (PRINIVIL, ZESTRIL) 10  mg tablet Take 1 Tab by mouth daily. 90 Tab 0         LABORATORY STUDIES:  Laboratory data shows serum sodium of 134 which is much improved.  The Rosula twice her normal, blood sugar is 83 and creatinine 1.0      PHYSICAL EXAM:  BP 165/87 mmHg   Pulse 73   Ht 5' 9.5" (1.765 m)   Wt 185 lb (83.915 kg)   BMI 26.94 kg/m2  Blood pressure little high, I rechecked it myself and got the same reading.  The patient is in good spirits and conversation is appropriate    TEST RESULTS:  No results found for any visits on 09/09/14.      DIAGNOSIS:  Problem List  Date Reviewed: 09/09/2014          Codes Class Noted    Alcoholism (HCC) ICD-10-CM: F10.20  ICD-9-CM: 303.90  09/13/2011        GERD with stricture ICD-10-CM: K21.9, K22.2  ICD-9-CM: 530.81, 530.3  06/11/2011    Overview Signed 06/11/2011  2:57 PM by Tyson AliasJohn L Laruen Risser, MD     Dilated by Dr Waynette ButteryGreer March 2012             Abnormal LFTs ICD-10-CM: R94.5  ICD-9-CM: 790.6  06/11/2011    Overview Signed 06/11/2011  3:04 PM by Tyson AliasJohn L Neya Creegan, MD     Probably related to alcohol             Dementia ICD-10-CM: F03.90  ICD-9-CM: 294.20  03/28/2011    Overview Addendum 05/03/2014  9:47 AM by Tyson AliasJohn L Tabbatha Bordelon, MD      MMSE 19/30 with abnormal clock 05/03/2014, 23/30 with abnormal clock March 2013,  22/30 with abnormal clock Aug 2012, Alzheimers vs Korsikoff's, long standing hx fairly heavy drinking of wine with wife             Hypertension ICD-10-CM: I10  ICD-9-CM: 401.9  03/28/2011    Overview Signed 03/28/2011 12:07 PM by Drema PryShayla Moultrie     Intolerant 40mg  lisinopril             Diastolic dysfunction ICD-10-CM: I51.9  ICD-9-CM: 429.9  01/12/2014    Overview Signed 01/12/2014 10:52 AM by Tyson AliasJohn L Ryley Teater, MD     Mild on echo spring 2015             Aortic regurgitation ICD-10-CM: I35.1  ICD-9-CM: 424.1  01/12/2014    Overview Signed 01/12/2014 10:52 AM by Tyson AliasJohn L Marbeth Smedley, MD     Mild to Moderate on echo spring 2015             Hyperlipidemia ICD-10-CM: E78.5  ICD-9-CM: 272.4  03/28/2011    Overview Addendum 03/28/2011 12:09 PM by Drema PryShayla Moultrie     High HDL  Framingham risk score of 7%  PosItive Calcium Score of 469; negative stress test             Metabolic syndrome ICD-10-CM: E88.81  ICD-9-CM: 277.7  03/28/2011    Overview Signed 03/28/2011 12:09 PM by Erie NoeShayla Moultrie     Fatty liver             Gout ICD-10-CM: M10.9  ICD-9-CM: 274.9  03/28/2011        Pancytopenia (HCC) ICD-10-CM: A21.30861.818  ICD-9-CM: 284.19  03/28/2011    Overview Addendum 09/13/2011  3:17 PM by Tyson AliasJohn L Taneeka Curtner, MD     Dr Enid BaasBolemon thinks ? Fatty liver, I suspect hypersplenism from suspected alcoholic cirrhosis  BPH (benign prostatic hyperplasia) ICD-10-CM: N40.0  ICD-9-CM: 600.00  03/28/2011    Overview Signed 03/28/2011 12:10 PM by Drema Pry Moultrie     Urethral stricture; Dr. Gerarda Fraction             Peptic ulcer ICD-10-CM: K27.9  ICD-9-CM: 533.90  03/28/2011    Overview Signed 03/28/2011 12:11 PM by Erie Noe     Dr.Grier             Senile dementia, uncomplicated ICD-10-CM: F03.90  ICD-9-CM: 290.0  05/03/2014        Chronic liver disease ICD-10-CM: K76.9  ICD-9-CM: 571.9  12/04/2011        Hyponatremia ICD-10-CM: E87.1  ICD-9-CM: 276.1  09/13/2011              DISPOSITION:   His dementia appears to be stable, he continues to consume alcohol.  His GERD is asymptomatic on omeprazole.  Aortic regurgitation is also asymptomatic but I think it's time to repeat his echo and we will schedule that.  BPH is asymptomatic, diastolic dysfunction is asymptomatic.  Hyponatremia has improved.    Blood pressure appears to high but the wife has not been checking it at home.  She is instructed to start checking it at home over the next few days, if it does not come down in the 150s or 140s then she will begin amlodipine daily.  I have given her a written prescription today but she will not get it filled until she found out what his baseline blood pressure reading as.    We'll see him back in 3 months and reassess everything including MMSE in preparation for his moving to [place] in August    Follow-up Disposition:  Return in about 3 months (around 12/10/2014).  Orders Placed This Encounter   ??? BMP   ??? CBC   ??? Lipid Panel   ??? Liver Panel   ??? TSH   ??? Uric Acid   ??? 2D ECHO COMPLETE ADULT (TTE) W OR WO CONTR   ??? folic acid-vit b6-vit b12 (FOLTX) 2.5-25-2 mg tablet   ??? omeprazole (PRILOSEC) 20 mg capsule   ??? amLODIPine (NORVASC) 2.5 mg tablet     Tyson Alias, MD  09/09/2014      Elements of this note have been dictated using speech recognition software.  As a result, errors of speech recognition may have occurred. Please excuse.

## 2014-09-16 DIAGNOSIS — R413 Other amnesia: Secondary | ICD-10-CM | POA: Diagnosis not present

## 2014-09-17 ENCOUNTER — Inpatient Hospital Stay: Admit: 2014-09-17 | Payer: MEDICARE | Attending: Family Medicine | Primary: Family Medicine

## 2014-09-17 DIAGNOSIS — I080 Rheumatic disorders of both mitral and aortic valves: Secondary | ICD-10-CM

## 2014-09-17 DIAGNOSIS — I517 Cardiomegaly: Secondary | ICD-10-CM | POA: Diagnosis not present

## 2014-09-17 DIAGNOSIS — Z09 Encounter for follow-up examination after completed treatment for conditions other than malignant neoplasm: Secondary | ICD-10-CM | POA: Diagnosis not present

## 2014-09-17 DIAGNOSIS — I352 Nonrheumatic aortic (valve) stenosis with insufficiency: Secondary | ICD-10-CM | POA: Diagnosis not present

## 2014-09-21 NOTE — Progress Notes (Signed)
Quick Note:        Left message for pt to call office    ______

## 2014-09-21 NOTE — Progress Notes (Signed)
Quick Note:        Notify him Echo looks good, no change, has minimal leakage of one of his valves, I doubt it will become a problem    ______

## 2014-09-30 NOTE — Progress Notes (Signed)
Quick Note:        Left message for pt to call office    ______

## 2014-10-04 NOTE — Progress Notes (Signed)
Quick Note:        Wife was notified of echo results    ______

## 2014-12-20 ENCOUNTER — Encounter: Admit: 2014-12-20 | Discharge: 2014-12-20 | Payer: MEDICARE | Primary: Family Medicine

## 2014-12-20 DIAGNOSIS — I10 Essential (primary) hypertension: Secondary | ICD-10-CM

## 2014-12-20 DIAGNOSIS — D61818 Other pancytopenia: Secondary | ICD-10-CM | POA: Diagnosis not present

## 2014-12-20 DIAGNOSIS — M10079 Idiopathic gout, unspecified ankle and foot: Secondary | ICD-10-CM | POA: Diagnosis not present

## 2014-12-20 DIAGNOSIS — E782 Mixed hyperlipidemia: Secondary | ICD-10-CM | POA: Diagnosis not present

## 2014-12-20 DIAGNOSIS — E871 Hypo-osmolality and hyponatremia: Secondary | ICD-10-CM | POA: Diagnosis not present

## 2014-12-20 DIAGNOSIS — R7989 Other specified abnormal findings of blood chemistry: Secondary | ICD-10-CM | POA: Diagnosis not present

## 2014-12-20 LAB — CBC WITH AUTOMATED DIFF
ABS. GRANULOCYTES: 1.6 10*3/uL — ABNORMAL LOW (ref 2.0–7.8)
ABS. LYMPHOCYTES: 0.7 10*3/uL — ABNORMAL LOW (ref 2.0–7.8)
ABS. MONOCYTES: 0.1 10*3/uL (ref 0.10–1.08)
GRANULOCYTES: 65.7 % (ref 37.0–92.0)
HCT: 45.9 % (ref 41.0–54.0)
HGB: 15.3 g/dL (ref 14.0–18.0)
LYMPHOCYTES: 28.6 % (ref 20.5–51.1)
MCH: 31.8 pg (ref 27.0–34.0)
MCHC: 33.4 g/dL (ref 31.0–36.0)
MCV: 95.2 fL (ref 80.0–100.0)
MEAN PLATELET VOLUME: 8.3 fL
MONOCYTES: 5.7 % (ref 3.0–10.0)
PLATELET: 112 10*3/uL — ABNORMAL LOW (ref 140–440)
RBC: 4.82 M/uL (ref 4.40–6.20)
RDW: 12.8 %
WBC: 2.5 10*3/uL — ABNORMAL LOW (ref 4.0–11.0)

## 2014-12-20 LAB — LIPID PANEL
CHOL/HDL Ratio: 2.5 (ref 0.0–6.7)
Cholesterol, total: 247 mg/dl — ABNORMAL HIGH (ref 0–200)
HDL Cholesterol: 99 mg/dl (ref >40)
LDL, calculated: 123 mg/dL (ref 0–130)
Triglyceride: 123 mg/dl (ref 0–200)
VLDL, calculated: 25 mg/dL (ref 0–40)

## 2014-12-20 LAB — HEPATIC FUNCTION PANEL
ALT (SGPT): 90 IU/L — ABNORMAL HIGH (ref 10–35)
AST (SGOT): 130 IU/L — ABNORMAL HIGH (ref 16–40)
Albumin: 4.4 g/dl (ref 3.2–5.0)
Alk. phosphatase: 69 U/L (ref 31–130)
Bilirubin, direct: 0.3 mg/dl (ref 0.0–0.4)
Bilirubin, total: 1.4 mg/dl (ref 0.4–1.4)
Protein, total: 7.1 g/dl (ref 6.0–8.5)

## 2014-12-20 LAB — METABOLIC PANEL, BASIC
BUN: 11 mg/dl (ref 9–23)
CO2: 28 mmol/L (ref 20–32)
Calcium: 9.7 mg/dl (ref 8.4–10.5)
Chloride: 98 mmol/L (ref 97–111)
Creatinine: 1 mg/dL (ref 0.7–1.5)
GFR est AA: 92.06 mL/min (ref >60.00)
GFR est non-AA: 75.96 mL/min (ref >60.00)
Glucose: 78 mg/dl (ref 65–100)
Potassium: 4.4 mmol/L (ref 3.5–5.5)
Sodium: 137 mmol/L (ref 135–148)

## 2014-12-20 LAB — TSH 3RD GENERATION: TSH: 1.08 u[IU]/mL (ref 0.47–5.01)

## 2014-12-20 LAB — URIC ACID: Uric acid: 7.8 mg/dl — ABNORMAL HIGH (ref 2.4–7.0)

## 2014-12-21 DIAGNOSIS — L570 Actinic keratosis: Secondary | ICD-10-CM | POA: Diagnosis not present

## 2014-12-21 DIAGNOSIS — L821 Other seborrheic keratosis: Secondary | ICD-10-CM | POA: Diagnosis not present

## 2014-12-21 DIAGNOSIS — D485 Neoplasm of uncertain behavior of skin: Secondary | ICD-10-CM | POA: Diagnosis not present

## 2014-12-21 DIAGNOSIS — D1801 Hemangioma of skin and subcutaneous tissue: Secondary | ICD-10-CM | POA: Diagnosis not present

## 2014-12-21 DIAGNOSIS — L720 Epidermal cyst: Secondary | ICD-10-CM | POA: Diagnosis not present

## 2014-12-21 DIAGNOSIS — B354 Tinea corporis: Secondary | ICD-10-CM | POA: Diagnosis not present

## 2014-12-21 DIAGNOSIS — D044 Carcinoma in situ of skin of scalp and neck: Secondary | ICD-10-CM | POA: Diagnosis not present

## 2014-12-21 DIAGNOSIS — B356 Tinea cruris: Secondary | ICD-10-CM | POA: Diagnosis not present

## 2014-12-21 DIAGNOSIS — L814 Other melanin hyperpigmentation: Secondary | ICD-10-CM | POA: Diagnosis not present

## 2014-12-21 DIAGNOSIS — C44319 Basal cell carcinoma of skin of other parts of face: Secondary | ICD-10-CM | POA: Diagnosis not present

## 2014-12-27 ENCOUNTER — Encounter: Attending: Family Medicine | Primary: Family Medicine

## 2015-01-18 DIAGNOSIS — C4442 Squamous cell carcinoma of skin of scalp and neck: Secondary | ICD-10-CM | POA: Diagnosis not present

## 2015-01-18 DIAGNOSIS — L905 Scar conditions and fibrosis of skin: Secondary | ICD-10-CM | POA: Diagnosis not present

## 2015-01-18 DIAGNOSIS — D044 Carcinoma in situ of skin of scalp and neck: Secondary | ICD-10-CM | POA: Diagnosis not present

## 2015-01-25 ENCOUNTER — Ambulatory Visit: Admit: 2015-01-25 | Discharge: 2015-01-25 | Payer: MEDICARE | Attending: Family Medicine | Primary: Family Medicine

## 2015-01-25 DIAGNOSIS — F0390 Unspecified dementia without behavioral disturbance: Secondary | ICD-10-CM

## 2015-01-25 MED ORDER — AMLODIPINE 2.5 MG TAB
2.5 mg | ORAL_TABLET | Freq: Every day | ORAL | Status: AC
Start: 2015-01-25 — End: ?

## 2015-01-25 MED ORDER — MEMANTINE 10 MG TAB
10 mg | ORAL_TABLET | Freq: Two times a day (BID) | ORAL | Status: AC
Start: 2015-01-25 — End: ?

## 2015-01-25 MED ORDER — RIVASTIGMINE 13.3 MG/24 HOUR TRANSDERMAL PATCH
13.3 mg/24 hour | MEDICATED_PATCH | Freq: Every day | TRANSDERMAL | Status: AC
Start: 2015-01-25 — End: ?

## 2015-01-25 NOTE — Progress Notes (Signed)
Tyson Alias, M.D.  [facility]  [place], [place] [address]  01/25/2015 11:55 AM        [name]  DOB: [birth year]    CHIEF COMPLAINT:  Chief Complaint   Patient presents with   ??? Hypertension     The patient returns with his wife for final visit prior to his move to [place].  He is no longer playing golf, But he still drives, has not had any tickets or accidents.  He still walks without the use of any assistive devices and has had no falls.  He is not having any difficulty swallowing or indigestion or heartburn.  There have been no behavioral issues.  He and his wife continue to consume wine on a daily basis.  He is not having any indigestion or heart burn and has not had any attacks of gout in years.  There is been no shortness of breath or chest pains and he had a recent echocardiogram of his heart which showed stable mild to moderate aortic insufficiency.  He also had grade 1 diastolic dysfunction.    ROS:    He denies any headache or chest pain, no shortness of breath or cough, no indigestion or heartburn, no difficulty urinating or change in his bowel habits.  Comprehensive ROS otherwise negative.     PMFSH:  Past Surgical History   Procedure Laterality Date   ??? Hx other surgical       esophageal stricture- stretched twice   ??? Hx turp  1997   ??? Hx colonoscopy  07/21/2007     repeat 5 years     History     Social History   ??? Marital Status: MARRIED     Spouse Name: N/A   ??? Number of Children: N/A   ??? Years of Education: N/A     Social History Main Topics   ??? Smoking status: Former Smoker   ??? Smokeless tobacco: Never Used      Comment: quit cigs 1972   ??? Alcohol Use: 7.0 oz/week     14 Glasses of wine per week      Comment: 03/05/2007-nonex 1 month   ??? Drug Use: Not on file   ??? Sexual Activity: Not on file     Other Topics Concern   ??? Not on file     Social History Narrative       CURRENT MEDICATIONS:  Current Outpatient Prescriptions   Medication Sig Dispense Refill    ??? memantine (NAMENDA) 10 mg tablet Take 1 Tab by mouth two (2) times a day. 180 Tab 3   ??? rivastigmine (EXELON) 13.3 mg/24 hour patch 1 Patch by TransDERmal route daily. 90 Patch 3   ??? amLODIPine (NORVASC) 2.5 mg tablet Take 1 Tab by mouth daily. 90 Tab 3   ??? B12-levomefolate calcium-B6 2-1.13-25 mg tab Take  by mouth.     ??? folic acid-vit b6-vit b12 (FOLTX) 2.5-25-2 mg tablet Take 1 Tab by mouth daily. 90 Tab 3   ??? omeprazole (PRILOSEC) 20 mg capsule Take 1 Cap by mouth two (2) times a day. 180 Cap 3   ??? lisinopril (PRINIVIL, ZESTRIL) 10 mg tablet Take 1 Tab by mouth daily. 90 Tab 0   ??? multivitamins-minerals-lutein (CENTRUM SILVER) Tab Take  by mouth.             LABORATORY STUDIES:  Laboratory data from last week is reviewed.  CBC remains abnormal with white  count 2500 lately count 112,000 but differential is normal.  Electrolytes are normal with a creatinine of 1.0, estimated GFR of 75.  Fasting blood sugar is 78, AST 130, ALT 90,  Alkaline phosphatase normal at 69.Total cholesterol is 247 with an LDL of 123 and an HDL of 99.  TSH is normal at 1.08, uric acid slightly elevated at 7.8.      PHYSICAL EXAM:  BP 148/85 mmHg   Pulse 71   Ht 5' 9.5" (1.765 m)   Wt 184 lb (83.462 kg)   BMI 26.79 kg/m2  MMSE she continues to deteriorate, this time he scored only 17/30.  Clock drawing continues to be abnormal.  He is starting to have some difficulty writing.  Simple conversation is appropriate and he is pleasant and cooperative.    TEST RESULTS:  No results found for any visits on 02/07/15.      DIAGNOSIS:  Problem List  Date Reviewed: February 07, 2015          Codes Class Noted    Chronic alcoholic hepatitis ICD-10-CM: Z61.09  ICD-9-CM: 571.1  12/04/2011        Alcoholism (HCC) ICD-10-CM: F10.20  ICD-9-CM: 303.90  09/13/2011        GERD with stricture ICD-10-CM: K21.9, K22.2  ICD-9-CM: 530.81, 530.3  06/11/2011    Overview Signed 06/11/2011  2:57 PM by Tyson Alias, MD     Dilated by Dr Waynette Buttery March 2012              Dementia ICD-10-CM: F03.90  ICD-9-CM: 294.20  03/28/2011    Overview Addendum 2015/02/07 11:58 AM by Tyson Alias, MD     Alzheimers vs Korsikoff's, long standing hx fairly heavy drinking of wine with wife, MMSE 17/30 07-Feb-2015 with abnormal clock, 05/03/2014 19/30 with abnormal clock,  March 2013 23/30 with abnormal clock,  22/08 Mar 2011 with abnormal clock,             Hypertension ICD-10-CM: I10  ICD-9-CM: 401.9  03/28/2011    Overview Signed 03/28/2011 12:07 PM by Drema Pry Moultrie     Intolerant  lisinopril             Diastolic dysfunction ICD-10-CM: I51.9  ICD-9-CM: 429.9  01/12/2014    Overview Signed 01/12/2014 10:52 AM by Tyson Alias, MD     Mild on echo spring 2015             Aortic regurgitation ICD-10-CM: I35.1  ICD-9-CM: 424.1  01/12/2014    Overview Addendum 07-Feb-2015 11:55 AM by Tyson Alias, MD     Mild to Moderate on echo spring 2016             Hyperlipidemia ICD-10-CM: E78.5  ICD-9-CM: 272.4  03/28/2011    Overview Addendum 03/28/2011 12:09 PM by Drema Pry Moultrie     High HDL  Framingham risk score of 7%  PosItive Calcium Score of 469; negative stress test             Metabolic syndrome ICD-10-CM: E88.81  ICD-9-CM: 277.7  03/28/2011    Overview Signed 03/28/2011 12:09 PM by Erie Noe     Fatty liver             Gout ICD-10-CM: M10.9  ICD-9-CM: 274.9  03/28/2011        Pancytopenia (HCC) ICD-10-CM: U04.540  ICD-9-CM: 284.19  03/28/2011    Overview Addendum 09/13/2011  3:17 PM by Tyson Alias, MD     Dr Enid Baas thinks ? Fatty liver, I  suspect hypersplenism from suspected alcoholic cirrhosis             BPH (benign prostatic hyperplasia) ICD-10-CM: N40.0  ICD-9-CM: 600.00  03/28/2011    Overview Signed 03/28/2011 12:10 PM by Erie NoeShayla Moultrie     Urethral stricture; Dr. Gerarda FractionFlanagan             Peptic ulcer ICD-10-CM: K27.9  ICD-9-CM: 533.90  03/28/2011    Overview Signed 03/28/2011 12:11 PM by Erie NoeShayla Moultrie     Dr.Grier                   DISPOSITION:   Dementia continues to slowly progress and unfortunately he continues to drink alcohol.  Liver enzymes are a little worse again felt secondary to alcohol.  Pancytopenia continues to be stable, this is felt to be due to suspected hypersplenism from suspected cirrhosis.  GERD with is asymptomatic, hypertension is reasonable, lipids are elevated but we don't want to treat this because of the chronic liver disease.  Gout is asymptomatic.  He has mild to moderate aortic insufficiency by echo which is asymptomatic, has mild grade 1 diastolic dysfunction is also asymptomatic as he has his BPH.    He will continue his current plan of care and [place] and we will send the records once he gets established.  Again I have recommended to both the patient and his wife that he stop drinking.     Follow-up Disposition:  Return if symptoms worsen or fail to improve.  Orders Placed This Encounter   ??? memantine (NAMENDA) 10 mg tablet   ??? rivastigmine (EXELON) 13.3 mg/24 hour patch   ??? amLODIPine (NORVASC) 2.5 mg tablet     Tyson AliasJOHN L Jakeline Dave, MD  01/25/2015      Elements of this note have been dictated using speech recognition software.  As a result, errors of speech recognition may have occurred. Please excuse.

## 2015-02-01 DIAGNOSIS — L905 Scar conditions and fibrosis of skin: Secondary | ICD-10-CM | POA: Diagnosis not present

## 2015-02-01 DIAGNOSIS — C44319 Basal cell carcinoma of skin of other parts of face: Secondary | ICD-10-CM | POA: Diagnosis not present

## 2015-02-01 DIAGNOSIS — C4442 Squamous cell carcinoma of skin of scalp and neck: Secondary | ICD-10-CM | POA: Diagnosis not present

## 2015-04-14 DIAGNOSIS — C44319 Basal cell carcinoma of skin of other parts of face: Secondary | ICD-10-CM | POA: Diagnosis not present

## 2015-04-14 DIAGNOSIS — Z85828 Personal history of other malignant neoplasm of skin: Secondary | ICD-10-CM | POA: Diagnosis not present

## 2015-04-14 DIAGNOSIS — D692 Other nonthrombocytopenic purpura: Secondary | ICD-10-CM | POA: Diagnosis not present

## 2015-04-14 DIAGNOSIS — D485 Neoplasm of uncertain behavior of skin: Secondary | ICD-10-CM | POA: Diagnosis not present

## 2015-04-14 DIAGNOSIS — L570 Actinic keratosis: Secondary | ICD-10-CM | POA: Diagnosis not present

## 2015-04-14 DIAGNOSIS — L720 Epidermal cyst: Secondary | ICD-10-CM | POA: Diagnosis not present

## 2015-04-28 DIAGNOSIS — Z23 Encounter for immunization: Secondary | ICD-10-CM | POA: Diagnosis not present

## 2015-05-12 DIAGNOSIS — Z85828 Personal history of other malignant neoplasm of skin: Secondary | ICD-10-CM | POA: Diagnosis not present

## 2015-05-12 DIAGNOSIS — C44319 Basal cell carcinoma of skin of other parts of face: Secondary | ICD-10-CM | POA: Diagnosis not present

## 2015-07-15 DIAGNOSIS — F0390 Unspecified dementia without behavioral disturbance: Secondary | ICD-10-CM | POA: Diagnosis not present

## 2015-07-15 DIAGNOSIS — N401 Enlarged prostate with lower urinary tract symptoms: Secondary | ICD-10-CM | POA: Diagnosis not present

## 2015-07-15 DIAGNOSIS — Z6827 Body mass index (BMI) 27.0-27.9, adult: Secondary | ICD-10-CM | POA: Diagnosis not present

## 2015-07-15 DIAGNOSIS — K635 Polyp of colon: Secondary | ICD-10-CM | POA: Diagnosis not present

## 2015-07-15 DIAGNOSIS — K219 Gastro-esophageal reflux disease without esophagitis: Secondary | ICD-10-CM | POA: Diagnosis not present

## 2015-07-15 DIAGNOSIS — C4491 Basal cell carcinoma of skin, unspecified: Secondary | ICD-10-CM | POA: Diagnosis not present

## 2015-07-15 DIAGNOSIS — Z1389 Encounter for screening for other disorder: Secondary | ICD-10-CM | POA: Diagnosis not present

## 2015-07-15 DIAGNOSIS — E784 Other hyperlipidemia: Secondary | ICD-10-CM | POA: Diagnosis not present

## 2015-07-15 DIAGNOSIS — I10 Essential (primary) hypertension: Secondary | ICD-10-CM | POA: Diagnosis not present

## 2015-09-20 DIAGNOSIS — L570 Actinic keratosis: Secondary | ICD-10-CM | POA: Diagnosis not present

## 2015-09-20 DIAGNOSIS — D485 Neoplasm of uncertain behavior of skin: Secondary | ICD-10-CM | POA: Diagnosis not present

## 2015-09-20 DIAGNOSIS — D692 Other nonthrombocytopenic purpura: Secondary | ICD-10-CM | POA: Diagnosis not present

## 2015-09-20 DIAGNOSIS — D1801 Hemangioma of skin and subcutaneous tissue: Secondary | ICD-10-CM | POA: Diagnosis not present

## 2015-09-20 DIAGNOSIS — C44319 Basal cell carcinoma of skin of other parts of face: Secondary | ICD-10-CM | POA: Diagnosis not present

## 2015-09-20 DIAGNOSIS — Z85828 Personal history of other malignant neoplasm of skin: Secondary | ICD-10-CM | POA: Diagnosis not present

## 2015-10-24 ENCOUNTER — Encounter

## 2015-11-17 DIAGNOSIS — Z0100 Encounter for examination of eyes and vision without abnormal findings: Secondary | ICD-10-CM | POA: Diagnosis not present

## 2015-11-17 DIAGNOSIS — Z961 Presence of intraocular lens: Secondary | ICD-10-CM | POA: Diagnosis not present

## 2016-02-22 DIAGNOSIS — E784 Other hyperlipidemia: Secondary | ICD-10-CM | POA: Diagnosis not present

## 2016-02-22 DIAGNOSIS — R748 Abnormal levels of other serum enzymes: Secondary | ICD-10-CM | POA: Diagnosis not present

## 2016-02-22 DIAGNOSIS — I10 Essential (primary) hypertension: Secondary | ICD-10-CM | POA: Diagnosis not present

## 2016-02-22 DIAGNOSIS — Z125 Encounter for screening for malignant neoplasm of prostate: Secondary | ICD-10-CM | POA: Diagnosis not present

## 2016-02-24 DIAGNOSIS — N261 Atrophy of kidney (terminal): Secondary | ICD-10-CM | POA: Diagnosis not present

## 2016-02-27 DIAGNOSIS — R748 Abnormal levels of other serum enzymes: Secondary | ICD-10-CM | POA: Diagnosis not present

## 2016-03-01 DIAGNOSIS — R748 Abnormal levels of other serum enzymes: Secondary | ICD-10-CM | POA: Diagnosis not present

## 2016-03-15 ENCOUNTER — Encounter: Payer: Self-pay | Admitting: Internal Medicine

## 2016-03-26 DIAGNOSIS — R748 Abnormal levels of other serum enzymes: Secondary | ICD-10-CM | POA: Diagnosis not present

## 2016-03-27 DIAGNOSIS — D1801 Hemangioma of skin and subcutaneous tissue: Secondary | ICD-10-CM | POA: Diagnosis not present

## 2016-03-27 DIAGNOSIS — D485 Neoplasm of uncertain behavior of skin: Secondary | ICD-10-CM | POA: Diagnosis not present

## 2016-03-27 DIAGNOSIS — Z85828 Personal history of other malignant neoplasm of skin: Secondary | ICD-10-CM | POA: Diagnosis not present

## 2016-03-27 DIAGNOSIS — L570 Actinic keratosis: Secondary | ICD-10-CM | POA: Diagnosis not present

## 2016-03-27 DIAGNOSIS — D692 Other nonthrombocytopenic purpura: Secondary | ICD-10-CM | POA: Diagnosis not present

## 2016-03-27 DIAGNOSIS — L720 Epidermal cyst: Secondary | ICD-10-CM | POA: Diagnosis not present

## 2016-03-27 DIAGNOSIS — C44319 Basal cell carcinoma of skin of other parts of face: Secondary | ICD-10-CM | POA: Diagnosis not present

## 2016-04-11 DIAGNOSIS — Z85828 Personal history of other malignant neoplasm of skin: Secondary | ICD-10-CM | POA: Diagnosis not present

## 2016-04-11 DIAGNOSIS — C44319 Basal cell carcinoma of skin of other parts of face: Secondary | ICD-10-CM | POA: Diagnosis not present

## 2016-04-24 DIAGNOSIS — R748 Abnormal levels of other serum enzymes: Secondary | ICD-10-CM | POA: Diagnosis not present

## 2016-05-22 ENCOUNTER — Ambulatory Visit: Payer: Self-pay | Admitting: Internal Medicine

## 2016-07-20 DIAGNOSIS — L603 Nail dystrophy: Secondary | ICD-10-CM | POA: Diagnosis not present

## 2016-07-20 DIAGNOSIS — Z85828 Personal history of other malignant neoplasm of skin: Secondary | ICD-10-CM | POA: Diagnosis not present

## 2016-08-28 DIAGNOSIS — R748 Abnormal levels of other serum enzymes: Secondary | ICD-10-CM | POA: Diagnosis not present

## 2016-08-28 DIAGNOSIS — I10 Essential (primary) hypertension: Secondary | ICD-10-CM | POA: Diagnosis not present

## 2016-09-26 DIAGNOSIS — Z85828 Personal history of other malignant neoplasm of skin: Secondary | ICD-10-CM | POA: Diagnosis not present

## 2016-09-26 DIAGNOSIS — L821 Other seborrheic keratosis: Secondary | ICD-10-CM | POA: Diagnosis not present

## 2016-09-26 DIAGNOSIS — D692 Other nonthrombocytopenic purpura: Secondary | ICD-10-CM | POA: Diagnosis not present

## 2016-09-26 DIAGNOSIS — L570 Actinic keratosis: Secondary | ICD-10-CM | POA: Diagnosis not present

## 2016-09-26 DIAGNOSIS — D485 Neoplasm of uncertain behavior of skin: Secondary | ICD-10-CM | POA: Diagnosis not present

## 2016-09-26 DIAGNOSIS — C44619 Basal cell carcinoma of skin of left upper limb, including shoulder: Secondary | ICD-10-CM | POA: Diagnosis not present

## 2016-09-26 DIAGNOSIS — C44519 Basal cell carcinoma of skin of other part of trunk: Secondary | ICD-10-CM | POA: Diagnosis not present

## 2016-11-19 DIAGNOSIS — Z961 Presence of intraocular lens: Secondary | ICD-10-CM | POA: Diagnosis not present

## 2016-11-19 DIAGNOSIS — H04123 Dry eye syndrome of bilateral lacrimal glands: Secondary | ICD-10-CM | POA: Diagnosis not present

## 2017-02-25 DIAGNOSIS — E784 Other hyperlipidemia: Secondary | ICD-10-CM | POA: Diagnosis not present

## 2017-02-25 DIAGNOSIS — I10 Essential (primary) hypertension: Secondary | ICD-10-CM | POA: Diagnosis not present

## 2017-02-25 DIAGNOSIS — R7309 Other abnormal glucose: Secondary | ICD-10-CM | POA: Diagnosis not present

## 2017-02-26 DIAGNOSIS — R748 Abnormal levels of other serum enzymes: Secondary | ICD-10-CM | POA: Diagnosis not present

## 2017-03-05 DIAGNOSIS — Z1212 Encounter for screening for malignant neoplasm of rectum: Secondary | ICD-10-CM | POA: Diagnosis not present

## 2017-04-01 DIAGNOSIS — M25552 Pain in left hip: Secondary | ICD-10-CM | POA: Diagnosis not present

## 2017-04-01 DIAGNOSIS — Z01812 Encounter for preprocedural laboratory examination: Secondary | ICD-10-CM | POA: Diagnosis not present

## 2017-04-05 DIAGNOSIS — Z01812 Encounter for preprocedural laboratory examination: Secondary | ICD-10-CM | POA: Diagnosis not present

## 2017-04-11 DIAGNOSIS — M25552 Pain in left hip: Secondary | ICD-10-CM | POA: Diagnosis not present

## 2017-04-17 DIAGNOSIS — S73192A Other sprain of left hip, initial encounter: Secondary | ICD-10-CM | POA: Diagnosis not present

## 2017-04-17 DIAGNOSIS — M25552 Pain in left hip: Secondary | ICD-10-CM | POA: Diagnosis not present

## 2017-05-13 DIAGNOSIS — C44612 Basal cell carcinoma of skin of right upper limb, including shoulder: Secondary | ICD-10-CM | POA: Diagnosis not present

## 2017-05-13 DIAGNOSIS — Z85828 Personal history of other malignant neoplasm of skin: Secondary | ICD-10-CM | POA: Diagnosis not present

## 2017-05-13 DIAGNOSIS — D485 Neoplasm of uncertain behavior of skin: Secondary | ICD-10-CM | POA: Diagnosis not present

## 2017-05-13 DIAGNOSIS — D0359 Melanoma in situ of other part of trunk: Secondary | ICD-10-CM | POA: Diagnosis not present

## 2017-05-13 DIAGNOSIS — C44329 Squamous cell carcinoma of skin of other parts of face: Secondary | ICD-10-CM | POA: Diagnosis not present

## 2017-05-13 DIAGNOSIS — C44519 Basal cell carcinoma of skin of other part of trunk: Secondary | ICD-10-CM | POA: Diagnosis not present

## 2017-05-13 DIAGNOSIS — C44319 Basal cell carcinoma of skin of other parts of face: Secondary | ICD-10-CM | POA: Diagnosis not present

## 2017-06-10 DIAGNOSIS — D0359 Melanoma in situ of other part of trunk: Secondary | ICD-10-CM | POA: Diagnosis not present

## 2017-06-10 DIAGNOSIS — Z85828 Personal history of other malignant neoplasm of skin: Secondary | ICD-10-CM | POA: Diagnosis not present

## 2017-06-14 DIAGNOSIS — C44329 Squamous cell carcinoma of skin of other parts of face: Secondary | ICD-10-CM | POA: Diagnosis not present

## 2017-06-14 DIAGNOSIS — Z85828 Personal history of other malignant neoplasm of skin: Secondary | ICD-10-CM | POA: Diagnosis not present

## 2017-06-14 DIAGNOSIS — C44319 Basal cell carcinoma of skin of other parts of face: Secondary | ICD-10-CM | POA: Diagnosis not present

## 2017-09-02 DIAGNOSIS — R748 Abnormal levels of other serum enzymes: Secondary | ICD-10-CM | POA: Diagnosis not present

## 2017-09-23 DIAGNOSIS — M79652 Pain in left thigh: Secondary | ICD-10-CM | POA: Diagnosis not present

## 2017-09-23 DIAGNOSIS — M79659 Pain in unspecified thigh: Secondary | ICD-10-CM | POA: Diagnosis not present

## 2017-09-23 DIAGNOSIS — M25552 Pain in left hip: Secondary | ICD-10-CM | POA: Diagnosis not present

## 2017-10-04 ENCOUNTER — Other Ambulatory Visit (HOSPITAL_COMMUNITY): Payer: Self-pay | Admitting: Physician Assistant

## 2017-10-04 DIAGNOSIS — M25552 Pain in left hip: Secondary | ICD-10-CM

## 2017-10-15 ENCOUNTER — Encounter (HOSPITAL_COMMUNITY)
Admission: RE | Admit: 2017-10-15 | Discharge: 2017-10-15 | Disposition: A | Discharge status: Home / Self Care | Payer: Medicare Other | Source: Ambulatory Visit | Attending: Physician Assistant | Admitting: Physician Assistant

## 2017-10-15 DIAGNOSIS — M25552 Pain in left hip: Secondary | ICD-10-CM | POA: Diagnosis not present

## 2017-10-15 DIAGNOSIS — M1611 Unilateral primary osteoarthritis, right hip: Secondary | ICD-10-CM | POA: Diagnosis not present

## 2017-10-15 MED ORDER — TECHNETIUM TC 99M MEDRONATE IV KIT
25.0000 | PACK | Freq: Once | INTRAVENOUS | Status: AC | PRN
  Administered 2017-10-15: 25 via INTRAVENOUS

## 2017-10-22 DIAGNOSIS — M25552 Pain in left hip: Secondary | ICD-10-CM | POA: Diagnosis not present

## 2017-11-04 DIAGNOSIS — M7062 Trochanteric bursitis, left hip: Secondary | ICD-10-CM | POA: Diagnosis not present

## 2017-11-04 DIAGNOSIS — M62552 Muscle wasting and atrophy, not elsewhere classified, left thigh: Secondary | ICD-10-CM | POA: Diagnosis not present

## 2017-11-04 DIAGNOSIS — M25652 Stiffness of left hip, not elsewhere classified: Secondary | ICD-10-CM | POA: Diagnosis not present

## 2017-11-04 DIAGNOSIS — M25552 Pain in left hip: Secondary | ICD-10-CM | POA: Diagnosis not present

## 2017-11-07 DIAGNOSIS — M7062 Trochanteric bursitis, left hip: Secondary | ICD-10-CM | POA: Diagnosis not present

## 2017-11-07 DIAGNOSIS — M25652 Stiffness of left hip, not elsewhere classified: Secondary | ICD-10-CM | POA: Diagnosis not present

## 2017-11-07 DIAGNOSIS — M25552 Pain in left hip: Secondary | ICD-10-CM | POA: Diagnosis not present

## 2017-11-07 DIAGNOSIS — M62552 Muscle wasting and atrophy, not elsewhere classified, left thigh: Secondary | ICD-10-CM | POA: Diagnosis not present

## 2017-11-11 DIAGNOSIS — L570 Actinic keratosis: Secondary | ICD-10-CM | POA: Diagnosis not present

## 2017-11-11 DIAGNOSIS — B353 Tinea pedis: Secondary | ICD-10-CM | POA: Diagnosis not present

## 2017-11-11 DIAGNOSIS — Z85828 Personal history of other malignant neoplasm of skin: Secondary | ICD-10-CM | POA: Diagnosis not present

## 2017-11-11 DIAGNOSIS — Z85820 Personal history of malignant melanoma of skin: Secondary | ICD-10-CM | POA: Diagnosis not present

## 2017-11-11 DIAGNOSIS — D1801 Hemangioma of skin and subcutaneous tissue: Secondary | ICD-10-CM | POA: Diagnosis not present

## 2017-11-11 DIAGNOSIS — D692 Other nonthrombocytopenic purpura: Secondary | ICD-10-CM | POA: Diagnosis not present

## 2017-11-13 DIAGNOSIS — M7062 Trochanteric bursitis, left hip: Secondary | ICD-10-CM | POA: Diagnosis not present

## 2017-11-13 DIAGNOSIS — M25652 Stiffness of left hip, not elsewhere classified: Secondary | ICD-10-CM | POA: Diagnosis not present

## 2017-11-13 DIAGNOSIS — M62552 Muscle wasting and atrophy, not elsewhere classified, left thigh: Secondary | ICD-10-CM | POA: Diagnosis not present

## 2017-11-13 DIAGNOSIS — M25552 Pain in left hip: Secondary | ICD-10-CM | POA: Diagnosis not present

## 2017-11-22 DIAGNOSIS — H26492 Other secondary cataract, left eye: Secondary | ICD-10-CM | POA: Diagnosis not present

## 2017-11-27 DIAGNOSIS — M25552 Pain in left hip: Secondary | ICD-10-CM | POA: Diagnosis not present

## 2017-11-27 DIAGNOSIS — M62552 Muscle wasting and atrophy, not elsewhere classified, left thigh: Secondary | ICD-10-CM | POA: Diagnosis not present

## 2017-11-27 DIAGNOSIS — M7062 Trochanteric bursitis, left hip: Secondary | ICD-10-CM | POA: Diagnosis not present

## 2017-11-27 DIAGNOSIS — M25652 Stiffness of left hip, not elsewhere classified: Secondary | ICD-10-CM | POA: Diagnosis not present

## 2017-12-03 DIAGNOSIS — H9193 Unspecified hearing loss, bilateral: Secondary | ICD-10-CM | POA: Diagnosis not present

## 2017-12-03 DIAGNOSIS — H6123 Impacted cerumen, bilateral: Secondary | ICD-10-CM | POA: Diagnosis not present

## 2018-02-28 DIAGNOSIS — R7309 Other abnormal glucose: Secondary | ICD-10-CM | POA: Diagnosis not present

## 2018-02-28 DIAGNOSIS — I10 Essential (primary) hypertension: Secondary | ICD-10-CM | POA: Diagnosis not present

## 2018-02-28 DIAGNOSIS — R808 Other proteinuria: Secondary | ICD-10-CM | POA: Diagnosis not present

## 2018-02-28 DIAGNOSIS — Z125 Encounter for screening for malignant neoplasm of prostate: Secondary | ICD-10-CM | POA: Diagnosis not present

## 2018-02-28 DIAGNOSIS — E7849 Other hyperlipidemia: Secondary | ICD-10-CM | POA: Diagnosis not present

## 2018-03-06 DIAGNOSIS — B351 Tinea unguium: Secondary | ICD-10-CM | POA: Diagnosis not present

## 2018-03-12 DIAGNOSIS — Z6827 Body mass index (BMI) 27.0-27.9, adult: Secondary | ICD-10-CM | POA: Diagnosis not present

## 2018-03-12 DIAGNOSIS — Z23 Encounter for immunization: Secondary | ICD-10-CM | POA: Diagnosis not present

## 2018-03-12 DIAGNOSIS — R808 Other proteinuria: Secondary | ICD-10-CM | POA: Diagnosis not present

## 2018-03-12 DIAGNOSIS — D696 Thrombocytopenia, unspecified: Secondary | ICD-10-CM | POA: Diagnosis not present

## 2018-03-12 DIAGNOSIS — Z1389 Encounter for screening for other disorder: Secondary | ICD-10-CM | POA: Diagnosis not present

## 2018-03-12 DIAGNOSIS — R7309 Other abnormal glucose: Secondary | ICD-10-CM | POA: Diagnosis not present

## 2018-03-12 DIAGNOSIS — F0390 Unspecified dementia without behavioral disturbance: Secondary | ICD-10-CM | POA: Diagnosis not present

## 2018-03-12 DIAGNOSIS — Z0000 Encounter for general adult medical examination without abnormal findings: Secondary | ICD-10-CM | POA: Diagnosis not present

## 2018-03-12 DIAGNOSIS — C439 Malignant melanoma of skin, unspecified: Secondary | ICD-10-CM | POA: Diagnosis not present

## 2018-03-12 DIAGNOSIS — R748 Abnormal levels of other serum enzymes: Secondary | ICD-10-CM | POA: Diagnosis not present

## 2018-03-12 DIAGNOSIS — M25552 Pain in left hip: Secondary | ICD-10-CM | POA: Diagnosis not present

## 2018-03-12 DIAGNOSIS — N182 Chronic kidney disease, stage 2 (mild): Secondary | ICD-10-CM | POA: Diagnosis not present

## 2018-03-12 DIAGNOSIS — D692 Other nonthrombocytopenic purpura: Secondary | ICD-10-CM | POA: Diagnosis not present

## 2018-03-21 DIAGNOSIS — Z1212 Encounter for screening for malignant neoplasm of rectum: Secondary | ICD-10-CM | POA: Diagnosis not present

## 2018-05-14 DIAGNOSIS — D485 Neoplasm of uncertain behavior of skin: Secondary | ICD-10-CM | POA: Diagnosis not present

## 2018-05-14 DIAGNOSIS — D0339 Melanoma in situ of other parts of face: Secondary | ICD-10-CM | POA: Diagnosis not present

## 2018-05-14 DIAGNOSIS — B356 Tinea cruris: Secondary | ICD-10-CM | POA: Diagnosis not present

## 2018-05-14 DIAGNOSIS — D692 Other nonthrombocytopenic purpura: Secondary | ICD-10-CM | POA: Diagnosis not present

## 2018-05-14 DIAGNOSIS — Z85820 Personal history of malignant melanoma of skin: Secondary | ICD-10-CM | POA: Diagnosis not present

## 2018-05-14 DIAGNOSIS — Z85828 Personal history of other malignant neoplasm of skin: Secondary | ICD-10-CM | POA: Diagnosis not present

## 2018-05-14 DIAGNOSIS — C44319 Basal cell carcinoma of skin of other parts of face: Secondary | ICD-10-CM | POA: Diagnosis not present

## 2018-06-10 DIAGNOSIS — Q6689 Other  specified congenital deformities of feet: Secondary | ICD-10-CM | POA: Diagnosis not present

## 2018-06-10 DIAGNOSIS — B351 Tinea unguium: Secondary | ICD-10-CM | POA: Diagnosis not present

## 2018-06-10 DIAGNOSIS — L988 Other specified disorders of the skin and subcutaneous tissue: Secondary | ICD-10-CM | POA: Diagnosis not present

## 2018-06-10 DIAGNOSIS — Z85828 Personal history of other malignant neoplasm of skin: Secondary | ICD-10-CM | POA: Diagnosis not present

## 2018-06-10 DIAGNOSIS — D0339 Melanoma in situ of other parts of face: Secondary | ICD-10-CM | POA: Diagnosis not present

## 2018-09-01 DIAGNOSIS — H6091 Unspecified otitis externa, right ear: Secondary | ICD-10-CM | POA: Diagnosis not present

## 2018-09-01 DIAGNOSIS — Z6828 Body mass index (BMI) 28.0-28.9, adult: Secondary | ICD-10-CM | POA: Diagnosis not present

## 2018-09-01 DIAGNOSIS — H9193 Unspecified hearing loss, bilateral: Secondary | ICD-10-CM | POA: Diagnosis not present

## 2018-09-01 DIAGNOSIS — H6123 Impacted cerumen, bilateral: Secondary | ICD-10-CM | POA: Diagnosis not present

## 2018-10-13 DIAGNOSIS — S41122A Laceration with foreign body of left upper arm, initial encounter: Secondary | ICD-10-CM | POA: Diagnosis not present

## 2018-12-17 DIAGNOSIS — D0439 Carcinoma in situ of skin of other parts of face: Secondary | ICD-10-CM | POA: Diagnosis not present

## 2018-12-17 DIAGNOSIS — D485 Neoplasm of uncertain behavior of skin: Secondary | ICD-10-CM | POA: Diagnosis not present

## 2018-12-17 DIAGNOSIS — Z85820 Personal history of malignant melanoma of skin: Secondary | ICD-10-CM | POA: Diagnosis not present

## 2018-12-17 DIAGNOSIS — Z85828 Personal history of other malignant neoplasm of skin: Secondary | ICD-10-CM | POA: Diagnosis not present

## 2018-12-17 DIAGNOSIS — L570 Actinic keratosis: Secondary | ICD-10-CM | POA: Diagnosis not present

## 2018-12-17 DIAGNOSIS — B354 Tinea corporis: Secondary | ICD-10-CM | POA: Diagnosis not present

## 2018-12-22 IMAGING — NM NM BONE 3 PHASE
2 series · 12 of 12 positions shown · non-contrast
Comparison: None

CLINICAL DATA: Chronic mid left femur pain. No history of fracture,
trauma or surgery.

EXAM:
NUCLEAR MEDICINE 3-PHASE BONE SCAN
TECHNIQUE: Radionuclide angiographic images, immediate static blood pool
images, and 3-hour delayed static images were obtained of the lower
extremities after intravenous injection of radiopharmaceutical.
RADIOPHARMACEUTICALS:  20.0 mCi 3c-EEm MDP IV

[Series 1: flow · 4.14mm/px · 6 of 40 frames shown (1 of 2)]
[frame 4/40  full-range]
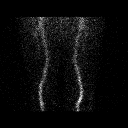
[frame 10/40  full-range]
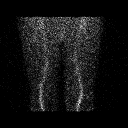
[frame 17/40  full-range]
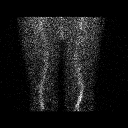
[frame 24/40  full-range]
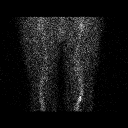
[frame 30/40  full-range]
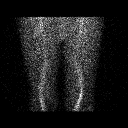
[frame 37/40  full-range]
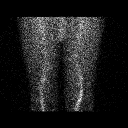

[Series 1: flow · 4.14mm/px · 6 of 40 frames shown (2 of 2)]
[frame 4/40  full-range]
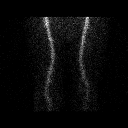
[frame 10/40  full-range]
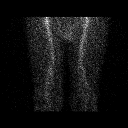
[frame 17/40  full-range]
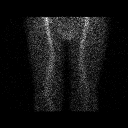
[frame 24/40  full-range]
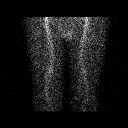
[frame 30/40  full-range]
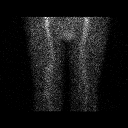
[frame 37/40  full-range]
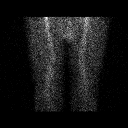

[12 of 12 positions shown; findings below may reference images not displayed]

FINDINGS: Vascular phase: Symmetric perfusion to both lower extremities.

Blood pool phase: No abnormal areas of asymmetric increased uptake
identified.

Delayed phase: No abnormal areas of increased uptake identified
along the mid shaft of left femur. Degenerative type changes are
noted involving the right hip.
IMPRESSION: 1. No abnormal uptake identified within the mid shaft of either
femur.
2. Degenerative type changes noted in the right hip.

## 2019-02-03 DIAGNOSIS — Z961 Presence of intraocular lens: Secondary | ICD-10-CM | POA: Diagnosis not present

## 2019-03-12 DIAGNOSIS — R739 Hyperglycemia, unspecified: Secondary | ICD-10-CM | POA: Diagnosis not present

## 2019-03-12 DIAGNOSIS — E7849 Other hyperlipidemia: Secondary | ICD-10-CM | POA: Diagnosis not present

## 2019-03-12 DIAGNOSIS — Z125 Encounter for screening for malignant neoplasm of prostate: Secondary | ICD-10-CM | POA: Diagnosis not present

## 2019-03-12 DIAGNOSIS — Z23 Encounter for immunization: Secondary | ICD-10-CM | POA: Diagnosis not present

## 2019-03-17 DIAGNOSIS — R82998 Other abnormal findings in urine: Secondary | ICD-10-CM | POA: Diagnosis not present

## 2019-03-18 DIAGNOSIS — Z1339 Encounter for screening examination for other mental health and behavioral disorders: Secondary | ICD-10-CM | POA: Diagnosis not present

## 2019-03-18 DIAGNOSIS — Z1331 Encounter for screening for depression: Secondary | ICD-10-CM | POA: Diagnosis not present

## 2019-06-10 ENCOUNTER — Other Ambulatory Visit: Payer: Self-pay

## 2019-06-10 DIAGNOSIS — Z20822 Contact with and (suspected) exposure to covid-19: Secondary | ICD-10-CM

## 2019-06-10 DIAGNOSIS — Z20828 Contact with and (suspected) exposure to other viral communicable diseases: Secondary | ICD-10-CM | POA: Diagnosis not present

## 2019-06-13 LAB — NOVEL CORONAVIRUS, NAA: SARS-CoV-2, NAA: NOT DETECTED

## 2019-06-18 DIAGNOSIS — Z85820 Personal history of malignant melanoma of skin: Secondary | ICD-10-CM | POA: Diagnosis not present

## 2019-06-18 DIAGNOSIS — Z85828 Personal history of other malignant neoplasm of skin: Secondary | ICD-10-CM | POA: Diagnosis not present

## 2019-06-18 DIAGNOSIS — L820 Inflamed seborrheic keratosis: Secondary | ICD-10-CM | POA: Diagnosis not present

## 2019-06-18 DIAGNOSIS — D1801 Hemangioma of skin and subcutaneous tissue: Secondary | ICD-10-CM | POA: Diagnosis not present

## 2019-06-18 DIAGNOSIS — L570 Actinic keratosis: Secondary | ICD-10-CM | POA: Diagnosis not present

## 2019-07-22 DIAGNOSIS — Z23 Encounter for immunization: Secondary | ICD-10-CM | POA: Diagnosis not present

## 2019-08-18 DIAGNOSIS — Z23 Encounter for immunization: Secondary | ICD-10-CM | POA: Diagnosis not present

## 2019-12-17 DIAGNOSIS — Z85828 Personal history of other malignant neoplasm of skin: Secondary | ICD-10-CM | POA: Diagnosis not present

## 2019-12-17 DIAGNOSIS — C4441 Basal cell carcinoma of skin of scalp and neck: Secondary | ICD-10-CM | POA: Diagnosis not present

## 2019-12-17 DIAGNOSIS — B354 Tinea corporis: Secondary | ICD-10-CM | POA: Diagnosis not present

## 2019-12-17 DIAGNOSIS — Z85820 Personal history of malignant melanoma of skin: Secondary | ICD-10-CM | POA: Diagnosis not present

## 2019-12-17 DIAGNOSIS — D485 Neoplasm of uncertain behavior of skin: Secondary | ICD-10-CM | POA: Diagnosis not present

## 2019-12-17 DIAGNOSIS — L570 Actinic keratosis: Secondary | ICD-10-CM | POA: Diagnosis not present

## 2020-03-11 DIAGNOSIS — Z125 Encounter for screening for malignant neoplasm of prostate: Secondary | ICD-10-CM | POA: Diagnosis not present

## 2020-03-11 DIAGNOSIS — E785 Hyperlipidemia, unspecified: Secondary | ICD-10-CM | POA: Diagnosis not present

## 2020-03-11 DIAGNOSIS — R739 Hyperglycemia, unspecified: Secondary | ICD-10-CM | POA: Diagnosis not present

## 2020-03-18 DIAGNOSIS — R82998 Other abnormal findings in urine: Secondary | ICD-10-CM | POA: Diagnosis not present

## 2020-03-18 DIAGNOSIS — I129 Hypertensive chronic kidney disease with stage 1 through stage 4 chronic kidney disease, or unspecified chronic kidney disease: Secondary | ICD-10-CM | POA: Diagnosis not present

## 2020-04-18 DIAGNOSIS — J302 Other seasonal allergic rhinitis: Secondary | ICD-10-CM | POA: Diagnosis not present

## 2020-04-18 DIAGNOSIS — N182 Chronic kidney disease, stage 2 (mild): Secondary | ICD-10-CM | POA: Diagnosis not present

## 2020-04-18 DIAGNOSIS — Z1152 Encounter for screening for COVID-19: Secondary | ICD-10-CM | POA: Diagnosis not present

## 2020-04-18 DIAGNOSIS — R059 Cough, unspecified: Secondary | ICD-10-CM | POA: Diagnosis not present

## 2020-04-18 DIAGNOSIS — I129 Hypertensive chronic kidney disease with stage 1 through stage 4 chronic kidney disease, or unspecified chronic kidney disease: Secondary | ICD-10-CM | POA: Diagnosis not present

## 2020-05-20 DIAGNOSIS — H26492 Other secondary cataract, left eye: Secondary | ICD-10-CM | POA: Diagnosis not present

## 2020-11-04 ENCOUNTER — Ambulatory Visit: Payer: Medicare Other | Attending: Internal Medicine

## 2020-11-04 ENCOUNTER — Other Ambulatory Visit (HOSPITAL_BASED_OUTPATIENT_CLINIC_OR_DEPARTMENT_OTHER): Payer: Self-pay

## 2020-11-04 DIAGNOSIS — Z23 Encounter for immunization: Secondary | ICD-10-CM

## 2020-11-04 MED ORDER — COVID-19 MRNA VACC (MODERNA) 100 MCG/0.5ML IM SUSP
INTRAMUSCULAR | 0 refills | Status: DC
  Filled 2020-11-04: qty 0.25, 1d supply, fill #0

## 2020-11-04 NOTE — Progress Notes (Signed)
   Covid-19 Vaccination Clinic  Name:  Noah Hughes    MRN: [id number] DOB: [birth year]  11/04/2020  [name] was observed post Covid-19 immunization for 15 minutes without incident. He was provided with Vaccine Information Sheet and instruction to access the V-Safe system.   [name] was instructed to call 911 with any severe reactions post vaccine: Marland Kitchen Difficulty breathing  . Swelling of face and throat  . A fast heartbeat  . A bad rash all over body  . Dizziness and weakness   Immunizations Administered    Name Date Dose VIS Date Route   Moderna Covid-19 Booster Vaccine 11/04/2020 11:15 AM 0.25 mL 04/27/2020 Intramuscular   Manufacturer: Moderna   Lot: [id number]   [place]: [id number]

## 2020-12-09 DIAGNOSIS — L720 Epidermal cyst: Secondary | ICD-10-CM | POA: Diagnosis not present

## 2020-12-09 DIAGNOSIS — Z85828 Personal history of other malignant neoplasm of skin: Secondary | ICD-10-CM | POA: Diagnosis not present

## 2021-02-15 DIAGNOSIS — Z85820 Personal history of malignant melanoma of skin: Secondary | ICD-10-CM | POA: Diagnosis not present

## 2021-02-15 DIAGNOSIS — B354 Tinea corporis: Secondary | ICD-10-CM | POA: Diagnosis not present

## 2021-02-15 DIAGNOSIS — L570 Actinic keratosis: Secondary | ICD-10-CM | POA: Diagnosis not present

## 2021-02-15 DIAGNOSIS — Z85828 Personal history of other malignant neoplasm of skin: Secondary | ICD-10-CM | POA: Diagnosis not present

## 2021-02-28 DIAGNOSIS — H10411 Chronic giant papillary conjunctivitis, right eye: Secondary | ICD-10-CM | POA: Diagnosis not present

## 2021-03-31 DIAGNOSIS — E785 Hyperlipidemia, unspecified: Secondary | ICD-10-CM | POA: Diagnosis not present

## 2021-03-31 DIAGNOSIS — Z125 Encounter for screening for malignant neoplasm of prostate: Secondary | ICD-10-CM | POA: Diagnosis not present

## 2021-04-07 DIAGNOSIS — Z1331 Encounter for screening for depression: Secondary | ICD-10-CM | POA: Diagnosis not present

## 2021-04-07 DIAGNOSIS — R82998 Other abnormal findings in urine: Secondary | ICD-10-CM | POA: Diagnosis not present

## 2021-04-07 DIAGNOSIS — Z1339 Encounter for screening examination for other mental health and behavioral disorders: Secondary | ICD-10-CM | POA: Diagnosis not present

## 2021-04-07 DIAGNOSIS — Z23 Encounter for immunization: Secondary | ICD-10-CM | POA: Diagnosis not present

## 2021-04-07 DIAGNOSIS — I129 Hypertensive chronic kidney disease with stage 1 through stage 4 chronic kidney disease, or unspecified chronic kidney disease: Secondary | ICD-10-CM | POA: Diagnosis not present

## 2021-04-21 DIAGNOSIS — Z23 Encounter for immunization: Secondary | ICD-10-CM | POA: Diagnosis not present

## 2021-05-06 DIAGNOSIS — Z20822 Contact with and (suspected) exposure to covid-19: Secondary | ICD-10-CM | POA: Diagnosis not present

## 2021-05-26 DIAGNOSIS — H04123 Dry eye syndrome of bilateral lacrimal glands: Secondary | ICD-10-CM | POA: Diagnosis not present

## 2021-05-26 DIAGNOSIS — Z961 Presence of intraocular lens: Secondary | ICD-10-CM | POA: Diagnosis not present

## 2021-08-22 DIAGNOSIS — Z85820 Personal history of malignant melanoma of skin: Secondary | ICD-10-CM | POA: Diagnosis not present

## 2021-08-22 DIAGNOSIS — B354 Tinea corporis: Secondary | ICD-10-CM | POA: Diagnosis not present

## 2021-08-22 DIAGNOSIS — L821 Other seborrheic keratosis: Secondary | ICD-10-CM | POA: Diagnosis not present

## 2021-08-22 DIAGNOSIS — Z85828 Personal history of other malignant neoplasm of skin: Secondary | ICD-10-CM | POA: Diagnosis not present

## 2021-08-22 DIAGNOSIS — D1801 Hemangioma of skin and subcutaneous tissue: Secondary | ICD-10-CM | POA: Diagnosis not present

## 2021-10-06 DIAGNOSIS — R945 Abnormal results of liver function studies: Secondary | ICD-10-CM | POA: Diagnosis not present

## 2021-10-06 DIAGNOSIS — N182 Chronic kidney disease, stage 2 (mild): Secondary | ICD-10-CM | POA: Diagnosis not present

## 2021-10-06 DIAGNOSIS — I129 Hypertensive chronic kidney disease with stage 1 through stage 4 chronic kidney disease, or unspecified chronic kidney disease: Secondary | ICD-10-CM | POA: Diagnosis not present

## 2021-10-06 DIAGNOSIS — R739 Hyperglycemia, unspecified: Secondary | ICD-10-CM | POA: Diagnosis not present

## 2021-10-06 DIAGNOSIS — R627 Adult failure to thrive: Secondary | ICD-10-CM | POA: Diagnosis not present

## 2021-10-06 DIAGNOSIS — E785 Hyperlipidemia, unspecified: Secondary | ICD-10-CM | POA: Diagnosis not present

## 2021-10-06 DIAGNOSIS — M25552 Pain in left hip: Secondary | ICD-10-CM | POA: Diagnosis not present

## 2021-10-06 DIAGNOSIS — K7030 Alcoholic cirrhosis of liver without ascites: Secondary | ICD-10-CM | POA: Diagnosis not present

## 2021-10-06 DIAGNOSIS — H6123 Impacted cerumen, bilateral: Secondary | ICD-10-CM | POA: Diagnosis not present

## 2021-10-06 DIAGNOSIS — F0390 Unspecified dementia without behavioral disturbance: Secondary | ICD-10-CM | POA: Diagnosis not present

## 2021-10-06 DIAGNOSIS — D696 Thrombocytopenia, unspecified: Secondary | ICD-10-CM | POA: Diagnosis not present

## 2021-10-06 DIAGNOSIS — D692 Other nonthrombocytopenic purpura: Secondary | ICD-10-CM | POA: Diagnosis not present

## 2021-12-26 DIAGNOSIS — H6123 Impacted cerumen, bilateral: Secondary | ICD-10-CM | POA: Diagnosis not present

## 2021-12-26 DIAGNOSIS — H9193 Unspecified hearing loss, bilateral: Secondary | ICD-10-CM | POA: Insufficient documentation

## 2021-12-31 DIAGNOSIS — I959 Hypotension, unspecified: Secondary | ICD-10-CM | POA: Diagnosis not present

## 2021-12-31 DIAGNOSIS — R55 Syncope and collapse: Secondary | ICD-10-CM | POA: Diagnosis not present

## 2022-02-02 ENCOUNTER — Other Ambulatory Visit (HOSPITAL_BASED_OUTPATIENT_CLINIC_OR_DEPARTMENT_OTHER): Payer: Self-pay

## 2022-02-21 ENCOUNTER — Encounter: Payer: Self-pay | Admitting: Internal Medicine

## 2022-02-21 ENCOUNTER — Non-Acute Institutional Stay: Payer: Medicare Other | Admitting: Internal Medicine

## 2022-02-21 VITALS — BP 136/84 | HR 60 | Temp 97.0°F | Wt 189.6 lb

## 2022-02-21 DIAGNOSIS — K219 Gastro-esophageal reflux disease without esophagitis: Secondary | ICD-10-CM

## 2022-02-21 DIAGNOSIS — N401 Enlarged prostate with lower urinary tract symptoms: Secondary | ICD-10-CM

## 2022-02-21 DIAGNOSIS — G301 Alzheimer's disease with late onset: Secondary | ICD-10-CM

## 2022-02-21 DIAGNOSIS — R350 Frequency of micturition: Secondary | ICD-10-CM | POA: Diagnosis not present

## 2022-02-21 DIAGNOSIS — I10 Essential (primary) hypertension: Secondary | ICD-10-CM

## 2022-02-21 DIAGNOSIS — E785 Hyperlipidemia, unspecified: Secondary | ICD-10-CM

## 2022-02-21 DIAGNOSIS — N189 Chronic kidney disease, unspecified: Secondary | ICD-10-CM | POA: Diagnosis not present

## 2022-02-21 DIAGNOSIS — C4490 Unspecified malignant neoplasm of skin, unspecified: Secondary | ICD-10-CM

## 2022-02-21 DIAGNOSIS — F02B0 Dementia in other diseases classified elsewhere, moderate, without behavioral disturbance, psychotic disturbance, mood disturbance, and anxiety: Secondary | ICD-10-CM | POA: Diagnosis not present

## 2022-02-21 NOTE — Progress Notes (Signed)
Location:  Occupational psychologist of Service:  Clinic (12)  Provider:   Code Status:  Goals of Care:      No data to display           Chief Complaint  Patient presents with   Establish Care    NP to Establish    HPI: Patient is a 87 y.o. male seen today for medical management of chronic diseases.   Patient has a history of Dementia Per his wife he was diagnosed with this in 2008 initially it was just MCI.  He had a complete evaluation at [facility].  He has been on the Exelon  patch and Namenda since then.  They never went back for any follow-up. Patient's symptoms have slowly gotten worse over many years. He now has aphasia.  Candis Musa off from the apartment sometimes.  Does need some help with his ADLs and has had few fecal incontinence.   Patient's wife has caregivers couple of hours a day so that she can go and do her activities.  He does need constant supervision.  He walks with no assist and has not had any falls  Patient also has a history of BPH diagnosed many years ago has not seen urologist recently History of skin cancer has seen Dr. Ronnald Ramp and regularly follows with dermatology Patient did not have any new complaints but was unable to give me much history due to his aphasia and dementia  Past Medical History:  Diagnosis Date   Chronic kidney disease    Hyperlipidemia    Hypertension     History reviewed. No pertinent surgical history.  No Known Allergies  Outpatient Encounter Medications as of 02/21/2022  Medication Sig   B Complex Vitamins (VITAMIN B COMPLEX) TABS Take 1 tablet by mouth daily.   COVID-19 mRNA vaccine, Moderna, 100 MCG/0.5ML injection Inject into the muscle.   memantine (NAMENDA) 10 MG tablet Take 10 mg by mouth 2 (two) times daily.   Metoprolol Succinate 25 MG CS24 Take one tablet by mouth once daily.   Multiple Vitamin (MULTIVITAMIN) tablet Take 1 tablet by mouth daily.   pantoprazole (PROTONIX) 40 MG tablet Take 40 mg by  mouth daily.   rivastigmine (EXELON) 13.3 MG/24HR Place 13.3 mg onto the skin daily.   No facility-administered encounter medications on file as of 02/21/2022.    Review of Systems:  Review of Systems  Unable to perform ROS: Dementia (AS per Presenting Comlains)    Health Maintenance  Topic Date Due   Pneumonia Vaccine 38+ Years old (1 - PCV) 02/04/2002   TETANUS/TDAP  07/09/2020   COVID-19 Vaccine (5 - Moderna risk series) 02/05/2022   INFLUENZA VACCINE  02/06/2022   Zoster Vaccines- Shingrix  Completed   HPV VACCINES  Aged Out    Physical Exam: Vitals:   02/21/22 0914  BP: 136/84  Pulse: 60  Temp: (!) 97 F (36.1 C)  TempSrc: Skin  SpO2: 97%  Weight: 189 lb 9.6 oz (86 kg)   There is no height or weight on file to calculate BMI. Physical Exam Vitals reviewed.  Constitutional:      Appearance: Normal appearance.  HENT:     Head: Normocephalic.     Nose: Nose normal.     Mouth/Throat:     Mouth: Mucous membranes are moist.     Pharynx: Oropharynx is clear.  Eyes:     Pupils: Pupils are equal, round, and reactive to light.  Cardiovascular:  Rate and Rhythm: Normal rate and regular rhythm.     Pulses: Normal pulses.     Heart sounds: No murmur heard. Pulmonary:     Effort: Pulmonary effort is normal. No respiratory distress.     Breath sounds: Normal breath sounds. No rales.  Abdominal:     General: Abdomen is flat. Bowel sounds are normal.     Palpations: Abdomen is soft.  Musculoskeletal:        General: No swelling.     Cervical back: Neck supple.  Skin:    General: Skin is warm.  Neurological:     General: No focal deficit present.     Mental Status: He is alert.     Comments: Patient was oriented to himself Did have aphasia but very pleasant Could not tell me his age or DOB  Psychiatric:        Mood and Affect: Mood normal.        Thought Content: Thought content normal.     Labs reviewed: Basic Metabolic Panel: No results for input(s):  "NA", "K", "CL", "CO2", "GLUCOSE", "BUN", "CREATININE", "CALCIUM", "MG", "PHOS", "TSH" in the last 8760 hours. Liver Function Tests: No results for input(s): "AST", "ALT", "ALKPHOS", "BILITOT", "PROT", "ALBUMIN" in the last 8760 hours. No results for input(s): "LIPASE", "AMYLASE" in the last 8760 hours. No results for input(s): "AMMONIA" in the last 8760 hours. CBC: No results for input(s): "WBC", "NEUTROABS", "HGB", "HCT", "MCV", "PLT" in the last 8760 hours. Lipid Panel: No results for input(s): "CHOL", "HDL", "LDLCALC", "TRIG", "CHOLHDL", "LDLDIRECT" in the last 8760 hours. No results found for: "HGBA1C"  Procedures since last visit: No results found.  Assessment/Plan 1. Moderate late onset Alzheimer's dementia without behavioral disturbance, psychotic disturbance, mood disturbance, or anxiety (HCC) Doing well with Caregivers and his wife On Exelon patch and Namenda Needs Constant Supervision MMSE next visit  2. Primary hypertension Good on Metoprolol  3. Gastroesophageal reflux disease without esophagitis Continue on Protonix  4. Chronic kidney disease, unspecified CKD stage Repeat Labs  5. Benign prostatic hyperplasia with urinary frequency Does not want any new Meds or Urology Eval  6. Hyperlipidemia, unspecified hyperlipidemia type Repeat Lipid  7. H/o Skin cancer Follows with Dermatology 8 Fecal Incontinence With the wife not sure if he is constipated having too much bowels.  I did not want to change anything today the wife will  keep a log and bring it in the next  Labs/tests ordered:  CBC,CMP,TSH,Lipid,  Next appt:  04/25/2022

## 2022-03-01 DIAGNOSIS — E119 Type 2 diabetes mellitus without complications: Secondary | ICD-10-CM | POA: Diagnosis not present

## 2022-03-01 LAB — BASIC METABOLIC PANEL
BUN: 15 (ref 4–21)
CO2: 26 — AB (ref 13–22)
Chloride: 97 — AB (ref 99–108)
Creatinine: 1.2 (ref 0.6–1.3)
Glucose: 97
Potassium: 4.7 mEq/L (ref 3.5–5.1)
Sodium: 136 — AB (ref 137–147)

## 2022-03-01 LAB — LIPID PANEL
Cholesterol: 200 (ref 0–200)
HDL: 85 — AB (ref 35–70)
LDL Cholesterol: 96
Triglycerides: 95 (ref 40–160)

## 2022-03-01 LAB — HEPATIC FUNCTION PANEL
ALT: 37 U/L (ref 10–40)
AST: 51 — AB (ref 14–40)
Alkaline Phosphatase: 109 (ref 25–125)
Bilirubin, Total: 1.4

## 2022-03-01 LAB — COMPREHENSIVE METABOLIC PANEL
Albumin: 4.3 (ref 3.5–5.0)
Calcium: 10 (ref 8.7–10.7)
eGFR: 60

## 2022-03-01 LAB — TSH: TSH: 1.16 (ref 0.41–5.90)

## 2022-03-02 ENCOUNTER — Encounter: Payer: Self-pay | Admitting: Internal Medicine

## 2022-03-07 DIAGNOSIS — L821 Other seborrheic keratosis: Secondary | ICD-10-CM | POA: Diagnosis not present

## 2022-03-07 DIAGNOSIS — C4441 Basal cell carcinoma of skin of scalp and neck: Secondary | ICD-10-CM | POA: Diagnosis not present

## 2022-03-07 DIAGNOSIS — D485 Neoplasm of uncertain behavior of skin: Secondary | ICD-10-CM | POA: Diagnosis not present

## 2022-03-07 DIAGNOSIS — C44219 Basal cell carcinoma of skin of left ear and external auricular canal: Secondary | ICD-10-CM | POA: Diagnosis not present

## 2022-03-07 DIAGNOSIS — Z85828 Personal history of other malignant neoplasm of skin: Secondary | ICD-10-CM | POA: Diagnosis not present

## 2022-03-07 DIAGNOSIS — C44629 Squamous cell carcinoma of skin of left upper limb, including shoulder: Secondary | ICD-10-CM | POA: Diagnosis not present

## 2022-03-07 DIAGNOSIS — B354 Tinea corporis: Secondary | ICD-10-CM | POA: Diagnosis not present

## 2022-03-26 ENCOUNTER — Non-Acute Institutional Stay: Payer: Medicare Other | Admitting: Adult Health

## 2022-03-26 ENCOUNTER — Encounter: Payer: Self-pay | Admitting: Adult Health

## 2022-03-26 ENCOUNTER — Other Ambulatory Visit (HOSPITAL_BASED_OUTPATIENT_CLINIC_OR_DEPARTMENT_OTHER): Payer: Self-pay

## 2022-03-26 VITALS — BP 138/86 | HR 71 | Temp 97.7°F | Wt 192.6 lb

## 2022-03-26 DIAGNOSIS — R310 Gross hematuria: Secondary | ICD-10-CM | POA: Diagnosis not present

## 2022-03-26 DIAGNOSIS — N401 Enlarged prostate with lower urinary tract symptoms: Secondary | ICD-10-CM | POA: Diagnosis not present

## 2022-03-26 DIAGNOSIS — R350 Frequency of micturition: Secondary | ICD-10-CM

## 2022-03-26 MED ORDER — CEPHALEXIN 500 MG PO CAPS
500.0000 mg | ORAL_CAPSULE | Freq: Three times a day (TID) | ORAL | 0 refills | Status: AC
  Filled 2022-03-26: qty 21, 7d supply, fill #0

## 2022-03-26 MED ORDER — RIVASTIGMINE 13.3 MG/24HR TD PT24
13.3000 mg | MEDICATED_PATCH | Freq: Every day | TRANSDERMAL | 5 refills | Status: DC
  Filled 2022-03-26: qty 30, 30d supply, fill #0
  Filled 2022-05-07: qty 30, 30d supply, fill #1
  Filled 2022-06-11: qty 30, 30d supply, fill #2
  Filled 2022-07-14: qty 30, 30d supply, fill #3
  Filled 2022-08-11: qty 30, 30d supply, fill #4
  Filled 2022-09-13: qty 30, 30d supply, fill #5

## 2022-03-26 NOTE — Patient Instructions (Signed)
A referral was sent to [facility]. If you do not hear from them in the next 2- 3 days please given them a call  Please start your antibiotic and let us know if you are not improving.   Will call regarding urine culture results.

## 2022-03-26 NOTE — Progress Notes (Signed)
Location:  [facility] of Service:  Clinic (12) Provider:   Cindi Carbon, Alexander [phone]   Virgie Dad, MD  Patient Care Team: Virgie Dad, MD as PCP - General (Internal Medicine) Jola Schmidt, MD as Consulting Physician (Ophthalmology) Carmela Rima, DMD (Dentistry)  Extended Emergency Contact Information Primary Emergency Contact: no,one Home Phone: [phone] Relation: None  Code Status:   Goals of care: Advanced Directive information     No data to display           Chief Complaint  Patient presents with   Acute Visit    Complains of Blood in Urine since Friday.U/A obtained in Facility.     HPI:  Pt is a 87 y.o. male seen today for an acute visit for hematuria.  He has had pink and dark colored urine for the past 3 days. No noted fever or back pain. No fever. Has chronic frequency. He has moderate dementia and is ambulatory but not able to contribute to the history. He is comfortable sitting in the chair and his wife is with him. He has a hx of BPH and has had a TURP. Has not seen urology in 15+ years.   Recent BMP reviewed from 03/01/22.  BUN 15 Cr 1.2 Past Medical History:  Diagnosis Date   BPH (benign prostatic hyperplasia)    Chronic kidney disease    Dementia (HCC)    GERD (gastroesophageal reflux disease)    Hyperlipidemia    Hypertension    Past Surgical History:  Procedure Laterality Date   TRANSURETHRAL RESECTION OF PROSTATE      No Known Allergies  Outpatient Encounter Medications as of 03/26/2022  Medication Sig   B Complex Vitamins (VITAMIN B COMPLEX) TABS Take 1 tablet by mouth daily.   cephALEXin (KEFLEX) 500 MG capsule Take 1 capsule (500 mg total) by mouth 3 (three) times daily for 7 days.   memantine (NAMENDA) 10 MG tablet Take 10 mg by mouth 2 (two) times daily.   Metoprolol Succinate 25 MG CS24 Take one tablet by mouth once daily.   Multiple Vitamin (MULTIVITAMIN)  tablet Take 1 tablet by mouth daily.   pantoprazole (PROTONIX) 40 MG tablet Take 40 mg by mouth daily.   [DISCONTINUED] rivastigmine (EXELON) 13.3 MG/24HR Place 13.3 mg onto the skin daily.   rivastigmine (EXELON) 13.3 MG/24HR Place 1 patch (13.3 mg total) onto the skin daily.   [DISCONTINUED] COVID-19 mRNA vaccine, Moderna, 100 MCG/0.5ML injection Inject into the muscle.   No facility-administered encounter medications on file as of 03/26/2022.    Review of Systems  Constitutional:  Negative for activity change, appetite change, chills, diaphoresis, fatigue, fever and unexpected weight change.  Respiratory:  Negative for cough, shortness of breath, wheezing and stridor.   Cardiovascular:  Negative for chest pain, palpitations and leg swelling.  Gastrointestinal:  Negative for abdominal distention, abdominal pain, constipation, diarrhea and nausea.  Genitourinary:  Positive for frequency and hematuria. Negative for decreased urine volume, difficulty urinating, dysuria, flank pain, penile discharge, penile pain, penile swelling, scrotal swelling, testicular pain and urgency.  Musculoskeletal:  Negative for arthralgias, back pain, gait problem, joint swelling and myalgias.  Neurological:  Negative for dizziness, seizures, syncope, facial asymmetry, speech difficulty, weakness and headaches.  Hematological:  Negative for adenopathy. Does not bruise/bleed easily.  Psychiatric/Behavioral:  Positive for confusion. Negative for agitation and behavioral problems.     Immunization History  Administered Date(s) Administered   Influenza-Unspecified 05/06/2020  Moderna SARS-COV2 Booster Vaccination 11/04/2020, 12/11/2021   Moderna Sars-Covid-2 Vaccination 07/21/2019, 08/18/2019, 05/25/2020, 04/21/2021   Pneumococcal-Unspecified 06/22/2014   Td 07/09/2010   Zoster Recombinat (Shingrix) 07/09/2010, 05/09/2018   Pertinent  Health Maintenance Due  Topic Date Due   INFLUENZA VACCINE  02/06/2022        No data to display         Functional Status Survey:    Vitals:   03/26/22 1343  BP: 138/86  Pulse: 71  Temp: 97.7 F (36.5 C)  SpO2: 97%  Weight: 192 lb 9.6 oz (87.4 kg)   There is no height or weight on file to calculate BMI. Physical Exam Vitals reviewed.  Constitutional:      General: He is not in acute distress.    Appearance: He is not diaphoretic.  HENT:     Head: Normocephalic and atraumatic.  Neck:     Thyroid: No thyromegaly.     Vascular: No JVD.     Trachea: No tracheal deviation.  Cardiovascular:     Rate and Rhythm: Normal rate and regular rhythm.     Heart sounds: No murmur heard. Pulmonary:     Effort: Pulmonary effort is normal. No respiratory distress.     Breath sounds: Normal breath sounds. No wheezing.  Abdominal:     General: Bowel sounds are normal. There is no distension.     Palpations: Abdomen is soft.     Tenderness: There is no abdominal tenderness. There is no right CVA tenderness, left CVA tenderness or guarding.  Genitourinary:    Comments: Urine dark rusty color.  Lymphadenopathy:     Cervical: No cervical adenopathy.  Skin:    General: Skin is warm and dry.  Neurological:     Mental Status: He is alert.     Comments: Alert and able to f/c Minimal verbalization. Oriented to self.   Psychiatric:        Mood and Affect: Mood normal.     Labs reviewed: Recent Labs    03/01/22 0000  NA 136*  K 4.7  CL 97*  CO2 26*  BUN 15  CREATININE 1.2  CALCIUM 10.0   Recent Labs    03/01/22 0000  AST 51*  ALT 37  ALKPHOS 109  ALBUMIN 4.3   No results for input(s): "WBC", "NEUTROABS", "HGB", "HCT", "MCV", "PLT" in the last 8760 hours. Lab Results  Component Value Date   TSH 1.16 03/01/2022   No results found for: "HGBA1C" Lab Results  Component Value Date   CHOL 200 03/01/2022   HDL 85 (A) 03/01/2022   LDLCALC 96 03/01/2022   TRIG 95 03/01/2022    Significant Diagnostic Results in last 30 days:  No results  found.  Assessment/Plan  1. Gross hematuria  UA C and S sent  - cephALEXin (KEFLEX) 500 MG capsule; Take 1 capsule (500 mg total) by mouth 3 (three) times daily for 7 days.  Dispense: 21 capsule; Refill: 0 - Ambulatory referral to Urology  2. Benign prostatic hyperplasia with urinary frequency Hx of TURP See#1  Family/ staff Communication: resident   Labs/tests ordered: UA C and S  Total time 81mn:  time greater than 50% of total time spent doing pt counseling and coordination of care

## 2022-03-27 ENCOUNTER — Other Ambulatory Visit (HOSPITAL_BASED_OUTPATIENT_CLINIC_OR_DEPARTMENT_OTHER): Payer: Self-pay

## 2022-03-28 ENCOUNTER — Other Ambulatory Visit (HOSPITAL_BASED_OUTPATIENT_CLINIC_OR_DEPARTMENT_OTHER): Payer: Self-pay

## 2022-03-30 ENCOUNTER — Telehealth: Payer: Self-pay | Admitting: Adult Health

## 2022-03-30 NOTE — Telephone Encounter (Signed)
Clinic nurse Autumn from [facility] called to check on Noah Hughes. He is no longer having hematuria and seems to be doing well. His UA C and S grew more than three specimens and was likely contaminated. Since he has been on Keflex and not having any further symptoms will hold off on further testing. He is going to see urology, referral placed.

## 2022-04-10 ENCOUNTER — Other Ambulatory Visit (HOSPITAL_BASED_OUTPATIENT_CLINIC_OR_DEPARTMENT_OTHER): Payer: Self-pay

## 2022-04-10 DIAGNOSIS — E876 Hypokalemia: Secondary | ICD-10-CM | POA: Diagnosis not present

## 2022-04-10 DIAGNOSIS — Z23 Encounter for immunization: Secondary | ICD-10-CM | POA: Diagnosis not present

## 2022-04-10 LAB — BASIC METABOLIC PANEL
BUN: 13 (ref 4–21)
CO2: 27 — AB (ref 13–22)
Chloride: 99 (ref 99–108)
Creatinine: 1.2 (ref 0.6–1.3)
Glucose: 105
Potassium: 4.9 mEq/L (ref 3.5–5.1)
Sodium: 136 — AB (ref 137–147)

## 2022-04-10 LAB — COMPREHENSIVE METABOLIC PANEL
Albumin: 4.4 (ref 3.5–5.0)
Calcium: 10 (ref 8.7–10.7)
Globulin: 2.6

## 2022-04-10 LAB — HEPATIC FUNCTION PANEL
ALT: 40 U/L (ref 10–40)
AST: 57 — AB (ref 14–40)
Alkaline Phosphatase: 118 (ref 25–125)
Bilirubin, Total: 1.9

## 2022-04-10 MED ORDER — FLUAD QUADRIVALENT 0.5 ML IM PRSY
PREFILLED_SYRINGE | INTRAMUSCULAR | 0 refills | Status: DC
  Filled 2022-04-10: qty 0.5, 1d supply, fill #0

## 2022-04-24 ENCOUNTER — Non-Acute Institutional Stay: Payer: Medicare Other | Admitting: Internal Medicine

## 2022-04-24 ENCOUNTER — Encounter: Payer: Self-pay | Admitting: Internal Medicine

## 2022-04-24 VITALS — BP 142/84 | HR 60 | Temp 98.6°F | Resp 18 | Ht 69.0 in | Wt 191.8 lb

## 2022-04-24 DIAGNOSIS — R350 Frequency of micturition: Secondary | ICD-10-CM | POA: Diagnosis not present

## 2022-04-24 DIAGNOSIS — R310 Gross hematuria: Secondary | ICD-10-CM

## 2022-04-24 DIAGNOSIS — I10 Essential (primary) hypertension: Secondary | ICD-10-CM | POA: Diagnosis not present

## 2022-04-24 DIAGNOSIS — H6123 Impacted cerumen, bilateral: Secondary | ICD-10-CM

## 2022-04-24 DIAGNOSIS — E785 Hyperlipidemia, unspecified: Secondary | ICD-10-CM | POA: Diagnosis not present

## 2022-04-24 DIAGNOSIS — K219 Gastro-esophageal reflux disease without esophagitis: Secondary | ICD-10-CM

## 2022-04-24 DIAGNOSIS — G301 Alzheimer's disease with late onset: Secondary | ICD-10-CM

## 2022-04-24 DIAGNOSIS — N401 Enlarged prostate with lower urinary tract symptoms: Secondary | ICD-10-CM

## 2022-04-24 DIAGNOSIS — F02C0 Dementia in other diseases classified elsewhere, severe, without behavioral disturbance, psychotic disturbance, mood disturbance, and anxiety: Secondary | ICD-10-CM | POA: Diagnosis not present

## 2022-04-24 DIAGNOSIS — N189 Chronic kidney disease, unspecified: Secondary | ICD-10-CM | POA: Diagnosis not present

## 2022-04-24 NOTE — Patient Instructions (Addendum)
[facility]   [address]   [place], [place] [address]   [phone]    Debrox before cleaning. Ask them first  PCV 20 Pneumonia TDAP Tetanus shot also is due Urology referral for Hematuria

## 2022-04-24 NOTE — Progress Notes (Signed)
Location:  [place] of Service:  Clinic (12)  Provider:   Code Status:  Goals of Care:     04/24/2022    9:21 AM  Advanced Directives  Does Patient Have a Medical Advance Directive? Yes  Type of Paramedic of [place];Living will;Out of facility DNR (pink MOST or yellow form)  Copy of [place] in Chart? No - copy requested     Chief Complaint  Patient presents with   Medical Management of Chronic Issues    2 Month Follow up with MMSE     HPI: Patient is a 87 y.o. male seen today for medical management of chronic diseases.    Dementia Per his wife he was diagnosed with this in 2008 initially it was just MCI.  He had a complete evaluation at [facility].  He has been on the Exelon  patch and Namenda since then.  They never went back for any follow-up. Patient's symptoms have slowly gotten worse over many years. He now has aphasia.  Candis Musa off from the apartment sometimes.  Does need some help with his ADLs and has had few fecal incontinence.   Patient's wife has caregivers couple of hours a day so that she can go and do her activities.  He does need constant supervision.  He walks with no assist and has not had any falls  Hematuria Patient had an episode few weeks ago.  UA was positive for blood.  And leukocyte.  Was treated with Keflex but the urine culture was negative. According to the wife the hematuria resolved itself. Patient has had that before but has never had a work-up. Does have a history of BPH  History of skin cancer has seen Dr. Ronnald Ramp and regularly follows with dermatology  Patient had no new complaints unable to give much history due to his aphasia and dementia No falls walk without any assist  Past Medical History:  Diagnosis Date   BPH (benign prostatic hyperplasia)    Chronic kidney disease    Dementia (HCC)    GERD (gastroesophageal reflux disease)    Hyperlipidemia     Hypertension     Past Surgical History:  Procedure Laterality Date   TRANSURETHRAL RESECTION OF PROSTATE      No Known Allergies  Outpatient Encounter Medications as of 04/24/2022  Medication Sig   B Complex Vitamins (VITAMIN B COMPLEX) TABS Take 1 tablet by mouth daily.   influenza vaccine adjuvanted (FLUAD QUADRIVALENT) 0.5 ML injection Inject into the muscle.   memantine (NAMENDA) 10 MG tablet Take 10 mg by mouth 2 (two) times daily.   Metoprolol Succinate 25 MG CS24 Take one tablet by mouth once daily.   Multiple Vitamin (MULTIVITAMIN) tablet Take 1 tablet by mouth daily.   pantoprazole (PROTONIX) 40 MG tablet Take 40 mg by mouth daily.   rivastigmine (EXELON) 13.3 MG/24HR Place 1 patch (13.3 mg total) onto the skin daily.   No facility-administered encounter medications on file as of 04/24/2022.    Review of Systems:  Review of Systems  Constitutional:  Negative for activity change, appetite change and unexpected weight change.  HENT: Negative.    Respiratory:  Negative for cough and shortness of breath.   Cardiovascular:  Negative for leg swelling.  Gastrointestinal:  Negative for constipation.  Genitourinary:  Positive for frequency.  Musculoskeletal:  Negative for arthralgias, gait problem and myalgias.  Skin: Negative.  Negative for rash.  Neurological:  Negative for dizziness  and weakness.  Psychiatric/Behavioral:  Positive for confusion. Negative for sleep disturbance.   All other systems reviewed and are negative.   Health Maintenance  Topic Date Due   Pneumonia Vaccine 9+ Years old (1 - PCV) 02/04/2002   TETANUS/TDAP  07/09/2020   COVID-19 Vaccine (5 - Moderna risk series) 02/05/2022   INFLUENZA VACCINE  Completed   Zoster Vaccines- Shingrix  Completed   HPV VACCINES  Aged Out    Physical Exam: Vitals:   04/24/22 0919  BP: (!) 142/84  Pulse: 60  Resp: 18  Temp: 98.6 F (37 C)  TempSrc: Temporal  SpO2: 99%  Weight: 191 lb 12.8 oz (87 kg)   Height: '5\' 9"'$  (1.753 m)   Body mass index is 28.32 kg/m. Physical Exam Vitals reviewed.  Constitutional:      Appearance: Normal appearance.  HENT:     Head: Normocephalic.     Ears:     Comments: Wax in both ears    Nose: Nose normal.     Mouth/Throat:     Mouth: Mucous membranes are moist.     Pharynx: Oropharynx is clear.  Eyes:     Pupils: Pupils are equal, round, and reactive to light.  Cardiovascular:     Rate and Rhythm: Normal rate and regular rhythm.     Pulses: Normal pulses.     Heart sounds: No murmur heard. Pulmonary:     Effort: Pulmonary effort is normal. No respiratory distress.     Breath sounds: Normal breath sounds. No rales.  Abdominal:     General: Abdomen is flat. Bowel sounds are normal.     Palpations: Abdomen is soft.  Musculoskeletal:        General: No swelling.     Cervical back: Neck supple.  Skin:    General: Skin is warm.  Neurological:     General: No focal deficit present.     Mental Status: He is alert.     Comments: Does have aphasia  Psychiatric:        Mood and Affect: Mood normal.        Thought Content: Thought content normal.       04/24/2022    9:28 AM  MMSE - Mini Mental State Exam  Orientation to time 0  Orientation to Place 0  Registration 0  Attention/ Calculation 3  Recall 0  Language- name 2 objects 0  Language- repeat 0  Language- follow 3 step command 3  Language- read & follow direction 0  Write a sentence 0  Copy design 0  Total score 6     Labs reviewed: Basic Metabolic Panel: Recent Labs    03/01/22 0000 04/10/22 0000  NA 136* 136*  K 4.7 4.9  CL 97* 99  CO2 26* 27*  BUN 15 13  CREATININE 1.2 1.2  CALCIUM 10.0 10.0  TSH 1.16  --    Liver Function Tests: Recent Labs    03/01/22 0000 04/10/22 0000  AST 51* 57*  ALT 37 40  ALKPHOS 109 118  ALBUMIN 4.3 4.4   No results for input(s): "LIPASE", "AMYLASE" in the last 8760 hours. No results for input(s): "AMMONIA" in the last 8760  hours. CBC: No results for input(s): "WBC", "NEUTROABS", "HGB", "HCT", "MCV", "PLT" in the last 8760 hours. Lipid Panel: Recent Labs    03/01/22 0000  CHOL 200  HDL 85*  LDLCALC 96  TRIG 95   No results found for: "HGBA1C"  Procedures since last visit: No  results found.  Assessment/Plan 1. Gross hematuria  - Ambulatory referral to Urology  2. Benign prostatic hyperplasia with urinary frequency  - Ambulatory referral to Urology  3. Severe late onset Alzheimer's dementia without behavioral disturbance, psychotic disturbance, mood disturbance, or anxiety ([place]) On Exelon and Namenda Wife able to keep him at home right now  4. Primary hypertension Stable on Metoprolol  5. Gastroesophageal reflux disease without esophagitis On Protonix  6. Chronic kidney disease, stage 3 a   7. Hyperlipidemia, unspecified hyperlipidemia type LDL less then 100  8. Bilateral impacted cerumen ENT Info given to wife  9 Elevated AST and Bilirubin Wife says it has been like this before  I found [place] of Liver from 2017 and it was normal At this time with his Dementia will not do any more work up and monitor clinically  Labs/tests ordered:  CBC,CMP Lipid  Next appt:  Visit date not found

## 2022-04-25 ENCOUNTER — Encounter: Payer: Medicare Other | Admitting: Internal Medicine

## 2022-05-02 ENCOUNTER — Other Ambulatory Visit (HOSPITAL_BASED_OUTPATIENT_CLINIC_OR_DEPARTMENT_OTHER): Payer: Self-pay

## 2022-05-02 DIAGNOSIS — Z23 Encounter for immunization: Secondary | ICD-10-CM | POA: Diagnosis not present

## 2022-05-02 MED ORDER — PNEUMOCOCCAL 20-VAL CONJ VACC 0.5 ML IM SUSY
PREFILLED_SYRINGE | INTRAMUSCULAR | 0 refills | Status: DC
  Filled 2022-05-02: qty 0.5, 1d supply, fill #0

## 2022-05-08 ENCOUNTER — Other Ambulatory Visit (HOSPITAL_BASED_OUTPATIENT_CLINIC_OR_DEPARTMENT_OTHER): Payer: Self-pay

## 2022-05-09 ENCOUNTER — Other Ambulatory Visit (HOSPITAL_BASED_OUTPATIENT_CLINIC_OR_DEPARTMENT_OTHER): Payer: Self-pay

## 2022-05-09 DIAGNOSIS — Z23 Encounter for immunization: Secondary | ICD-10-CM | POA: Diagnosis not present

## 2022-05-22 ENCOUNTER — Other Ambulatory Visit (HOSPITAL_BASED_OUTPATIENT_CLINIC_OR_DEPARTMENT_OTHER): Payer: Self-pay

## 2022-05-22 DIAGNOSIS — R310 Gross hematuria: Secondary | ICD-10-CM | POA: Diagnosis not present

## 2022-05-22 DIAGNOSIS — R9341 Abnormal radiologic findings on diagnostic imaging of renal pelvis, ureter, or bladder: Secondary | ICD-10-CM | POA: Diagnosis not present

## 2022-05-22 DIAGNOSIS — N401 Enlarged prostate with lower urinary tract symptoms: Secondary | ICD-10-CM | POA: Diagnosis not present

## 2022-05-22 DIAGNOSIS — R3914 Feeling of incomplete bladder emptying: Secondary | ICD-10-CM | POA: Diagnosis not present

## 2022-05-22 MED ORDER — TAMSULOSIN HCL 0.4 MG PO CAPS
0.4000 mg | ORAL_CAPSULE | Freq: Every day | ORAL | 11 refills | Status: DC
  Filled 2022-05-22: qty 30, 30d supply, fill #0

## 2022-05-23 DIAGNOSIS — H6123 Impacted cerumen, bilateral: Secondary | ICD-10-CM | POA: Diagnosis not present

## 2022-06-05 DIAGNOSIS — Z961 Presence of intraocular lens: Secondary | ICD-10-CM | POA: Diagnosis not present

## 2022-06-12 ENCOUNTER — Other Ambulatory Visit (HOSPITAL_BASED_OUTPATIENT_CLINIC_OR_DEPARTMENT_OTHER): Payer: Self-pay

## 2022-06-14 ENCOUNTER — Other Ambulatory Visit (HOSPITAL_BASED_OUTPATIENT_CLINIC_OR_DEPARTMENT_OTHER): Payer: Self-pay

## 2022-07-14 ENCOUNTER — Other Ambulatory Visit (HOSPITAL_BASED_OUTPATIENT_CLINIC_OR_DEPARTMENT_OTHER): Payer: Self-pay

## 2022-07-16 ENCOUNTER — Other Ambulatory Visit (HOSPITAL_BASED_OUTPATIENT_CLINIC_OR_DEPARTMENT_OTHER): Payer: Self-pay

## 2022-07-18 ENCOUNTER — Other Ambulatory Visit (HOSPITAL_BASED_OUTPATIENT_CLINIC_OR_DEPARTMENT_OTHER): Payer: Self-pay

## 2022-07-18 DIAGNOSIS — N35012 Post-traumatic membranous urethral stricture: Secondary | ICD-10-CM | POA: Diagnosis not present

## 2022-07-18 DIAGNOSIS — R310 Gross hematuria: Secondary | ICD-10-CM | POA: Diagnosis not present

## 2022-07-18 DIAGNOSIS — N401 Enlarged prostate with lower urinary tract symptoms: Secondary | ICD-10-CM | POA: Diagnosis not present

## 2022-07-18 DIAGNOSIS — R3914 Feeling of incomplete bladder emptying: Secondary | ICD-10-CM | POA: Diagnosis not present

## 2022-07-18 MED ORDER — TAMSULOSIN HCL 0.4 MG PO CAPS
0.4000 mg | ORAL_CAPSULE | Freq: Every day | ORAL | 3 refills | Status: DC
  Filled 2022-07-18: qty 90, 90d supply, fill #0
  Filled 2022-10-19 – 2022-10-20 (×2): qty 90, 90d supply, fill #1

## 2022-08-11 ENCOUNTER — Other Ambulatory Visit (HOSPITAL_BASED_OUTPATIENT_CLINIC_OR_DEPARTMENT_OTHER): Payer: Self-pay

## 2022-08-13 ENCOUNTER — Other Ambulatory Visit (HOSPITAL_BASED_OUTPATIENT_CLINIC_OR_DEPARTMENT_OTHER): Payer: Self-pay

## 2022-08-14 ENCOUNTER — Other Ambulatory Visit (HOSPITAL_BASED_OUTPATIENT_CLINIC_OR_DEPARTMENT_OTHER): Payer: Self-pay

## 2022-08-27 ENCOUNTER — Other Ambulatory Visit: Payer: Self-pay | Admitting: Adult Health

## 2022-08-27 MED ORDER — PANTOPRAZOLE SODIUM 40 MG PO TBEC: 40.0000 mg | DELAYED_RELEASE_TABLET | Freq: Every day | ORAL | 1 refills | Status: DC

## 2022-09-06 DIAGNOSIS — D692 Other nonthrombocytopenic purpura: Secondary | ICD-10-CM | POA: Diagnosis not present

## 2022-09-06 DIAGNOSIS — L821 Other seborrheic keratosis: Secondary | ICD-10-CM | POA: Diagnosis not present

## 2022-09-06 DIAGNOSIS — D485 Neoplasm of uncertain behavior of skin: Secondary | ICD-10-CM | POA: Diagnosis not present

## 2022-09-06 DIAGNOSIS — L905 Scar conditions and fibrosis of skin: Secondary | ICD-10-CM | POA: Diagnosis not present

## 2022-09-06 DIAGNOSIS — C44519 Basal cell carcinoma of skin of other part of trunk: Secondary | ICD-10-CM | POA: Diagnosis not present

## 2022-09-06 DIAGNOSIS — L820 Inflamed seborrheic keratosis: Secondary | ICD-10-CM | POA: Diagnosis not present

## 2022-09-06 DIAGNOSIS — Z85820 Personal history of malignant melanoma of skin: Secondary | ICD-10-CM | POA: Diagnosis not present

## 2022-09-06 DIAGNOSIS — Z85828 Personal history of other malignant neoplasm of skin: Secondary | ICD-10-CM | POA: Diagnosis not present

## 2022-09-06 DIAGNOSIS — L570 Actinic keratosis: Secondary | ICD-10-CM | POA: Diagnosis not present

## 2022-09-14 ENCOUNTER — Other Ambulatory Visit (HOSPITAL_BASED_OUTPATIENT_CLINIC_OR_DEPARTMENT_OTHER): Payer: Self-pay

## 2022-09-15 ENCOUNTER — Other Ambulatory Visit (HOSPITAL_BASED_OUTPATIENT_CLINIC_OR_DEPARTMENT_OTHER): Payer: Self-pay

## 2022-10-16 ENCOUNTER — Other Ambulatory Visit (HOSPITAL_BASED_OUTPATIENT_CLINIC_OR_DEPARTMENT_OTHER): Payer: Self-pay

## 2022-10-16 ENCOUNTER — Other Ambulatory Visit: Payer: Self-pay | Admitting: Adult Health

## 2022-10-17 ENCOUNTER — Other Ambulatory Visit: Payer: Self-pay | Admitting: Internal Medicine

## 2022-10-17 ENCOUNTER — Other Ambulatory Visit (HOSPITAL_BASED_OUTPATIENT_CLINIC_OR_DEPARTMENT_OTHER): Payer: Self-pay

## 2022-10-17 DIAGNOSIS — R310 Gross hematuria: Secondary | ICD-10-CM | POA: Diagnosis not present

## 2022-10-17 DIAGNOSIS — R3914 Feeling of incomplete bladder emptying: Secondary | ICD-10-CM | POA: Diagnosis not present

## 2022-10-17 DIAGNOSIS — N401 Enlarged prostate with lower urinary tract symptoms: Secondary | ICD-10-CM | POA: Diagnosis not present

## 2022-10-17 DIAGNOSIS — N35012 Post-traumatic membranous urethral stricture: Secondary | ICD-10-CM | POA: Diagnosis not present

## 2022-10-17 MED ORDER — RIVASTIGMINE 13.3 MG/24HR TD PT24
13.3000 mg | MEDICATED_PATCH | Freq: Every day | TRANSDERMAL | 5 refills | Status: DC
  Filled 2022-10-17: qty 30, 30d supply, fill #0
  Filled 2022-11-10: qty 30, 30d supply, fill #1

## 2022-10-20 ENCOUNTER — Other Ambulatory Visit (HOSPITAL_BASED_OUTPATIENT_CLINIC_OR_DEPARTMENT_OTHER): Payer: Self-pay

## 2022-10-23 DIAGNOSIS — R310 Gross hematuria: Secondary | ICD-10-CM | POA: Diagnosis not present

## 2022-10-23 DIAGNOSIS — I10 Essential (primary) hypertension: Secondary | ICD-10-CM | POA: Diagnosis not present

## 2022-10-23 DIAGNOSIS — N401 Enlarged prostate with lower urinary tract symptoms: Secondary | ICD-10-CM | POA: Diagnosis not present

## 2022-10-23 DIAGNOSIS — R17 Unspecified jaundice: Secondary | ICD-10-CM | POA: Diagnosis not present

## 2022-10-23 LAB — BASIC METABOLIC PANEL
BUN: 13 (ref 4–21)
CO2: 26 — AB (ref 13–22)
Chloride: 100 (ref 99–108)
Creatinine: 1.1 (ref 0.6–1.3)
Glucose: 102
Potassium: 4.6 mEq/L (ref 3.5–5.1)
Sodium: 137 (ref 137–147)

## 2022-10-23 LAB — HEPATIC FUNCTION PANEL
ALT: 32 U/L (ref 10–40)
AST: 46 — AB (ref 14–40)
Alkaline Phosphatase: 112 (ref 25–125)
Bilirubin, Total: 1.4

## 2022-10-23 LAB — CBC: RBC: 4.61 (ref 3.87–5.11)

## 2022-10-23 LAB — COMPREHENSIVE METABOLIC PANEL
Albumin: 4.3 (ref 3.5–5.0)
Calcium: 9.6 (ref 8.7–10.7)

## 2022-10-23 LAB — CBC AND DIFFERENTIAL
HCT: 41 (ref 41–53)
Hemoglobin: 14.1 (ref 13.5–17.5)
Platelets: 65 10*3/uL — AB (ref 150–400)
WBC: 2.4

## 2022-10-24 ENCOUNTER — Telehealth: Payer: Self-pay | Admitting: Internal Medicine

## 2022-10-24 NOTE — Telephone Encounter (Signed)
I don't have any old CBC to compare. His Platelets in CBC today is 70 Discussed with Heather in [facility] Patient is not having any signs of Bleeding HGB is normal Will repeat CBC tomorrow to confirm Thrombocytopenia. Possible referral to Hematology if Wife agrees. He does have appointment with Neysa Bonito on Mon Patient does have Moderate Dementia

## 2022-10-25 DIAGNOSIS — R17 Unspecified jaundice: Secondary | ICD-10-CM | POA: Diagnosis not present

## 2022-10-25 DIAGNOSIS — I10 Essential (primary) hypertension: Secondary | ICD-10-CM | POA: Diagnosis not present

## 2022-10-25 DIAGNOSIS — R310 Gross hematuria: Secondary | ICD-10-CM | POA: Diagnosis not present

## 2022-10-25 LAB — CBC AND DIFFERENTIAL
Hemoglobin: 13.9 (ref 13.5–17.5)
Neutrophils Absolute: 1.2
Platelets: 68 K/uL — AB (ref 150–400)
WBC: 2.3

## 2022-10-25 LAB — CBC: RBC: 4.54 (ref 3.87–5.11)

## 2022-10-29 ENCOUNTER — Encounter: Payer: Self-pay | Admitting: Adult Health

## 2022-10-29 ENCOUNTER — Non-Acute Institutional Stay: Payer: Medicare Other | Admitting: Adult Health

## 2022-10-29 VITALS — BP 138/82 | HR 59 | Temp 98.0°F | Resp 16 | Ht 69.0 in | Wt 194.4 lb

## 2022-10-29 DIAGNOSIS — I10 Essential (primary) hypertension: Secondary | ICD-10-CM

## 2022-10-29 DIAGNOSIS — F02B0 Dementia in other diseases classified elsewhere, moderate, without behavioral disturbance, psychotic disturbance, mood disturbance, and anxiety: Secondary | ICD-10-CM | POA: Insufficient documentation

## 2022-10-29 DIAGNOSIS — N400 Enlarged prostate without lower urinary tract symptoms: Secondary | ICD-10-CM | POA: Insufficient documentation

## 2022-10-29 DIAGNOSIS — D72819 Decreased white blood cell count, unspecified: Secondary | ICD-10-CM | POA: Diagnosis not present

## 2022-10-29 DIAGNOSIS — K219 Gastro-esophageal reflux disease without esophagitis: Secondary | ICD-10-CM | POA: Diagnosis not present

## 2022-10-29 DIAGNOSIS — D696 Thrombocytopenia, unspecified: Secondary | ICD-10-CM | POA: Diagnosis not present

## 2022-10-29 DIAGNOSIS — G301 Alzheimer's disease with late onset: Secondary | ICD-10-CM | POA: Diagnosis not present

## 2022-10-29 DIAGNOSIS — E785 Hyperlipidemia, unspecified: Secondary | ICD-10-CM

## 2022-10-29 NOTE — Patient Instructions (Addendum)
Avoid ibuprofen, nsaids, aspirin  Referral was made to hematology/oncology if you have not heard from them in 2-3 days please give them a call  Recommend spring covid booster.

## 2022-10-29 NOTE — Progress Notes (Signed)
Location:  [facility]  POS: Clinic  Provider: Fletcher Anon, ANP   Goals of Care:     10/29/2022    1:19 PM  Advanced Directives  Does Patient Have a Medical Advance Directive? Yes  Type of Estate agent of [place];Living will;Out of facility DNR (pink MOST or yellow form)  Does patient want to make changes to medical advance directive? No - Patient declined  Copy of Healthcare Power of Attorney in Chart? No - copy requested     Chief Complaint  Patient presents with   Medication Management    Patient is being seen for 6 month follow up.   Immunizations    Discuss the need for Tdap and Covid vaccine.patient is getting vaccine thursday   Quality Metric Gaps    Discussed the need for AWV    HPI: Patient is a 88 y.o. male seen today for medical management of chronic diseases.    Dementia diagnosed as MCI in 2008 at [facility] progressed over time: On namenda and exelon, wife is caregiver. Has aphasia. Ambulatory  Independent with ADLS but requires some cuing and prompting.  Has some incontinence in the morning. Does not wear a brief No issues with dysphagia No wandering.  Has frustration with remembering words no aggression.  Sometimes has delusions   Routine labs show low plt and WBC  Lab Results  Component Value Date   PLT 68 (A) 10/25/2022   Lab Results  Component Value Date   WBC 2.3 10/25/2022  No bruising or bleeding.    BPH w/hx of TURP: on flomax  CKD: Lab Results  Component Value Date   BUN 13 10/23/2022   Lab Results  Component Value Date   CREATININE 1.1 10/23/2022     GERD: on protonix  HTN on metoprolol   HLD:  Lab Results  Component Value Date   LDLCALC 96 03/01/2022    Past Medical History:  Diagnosis Date   BPH (benign prostatic hyperplasia)    Chronic kidney disease    Dementia    GERD (gastroesophageal reflux disease)    Hyperlipidemia    Hypertension     Past Surgical History:  Procedure  Laterality Date   TRANSURETHRAL RESECTION OF PROSTATE      No Known Allergies  Outpatient Encounter Medications as of 10/29/2022  Medication Sig   B Complex Vitamins (VITAMIN B COMPLEX) TABS Take 1 tablet by mouth daily.   influenza vaccine adjuvanted (FLUAD QUADRIVALENT) 0.5 ML injection Inject into the muscle.   memantine (NAMENDA) 10 MG tablet Take 10 mg by mouth 2 (two) times daily.   Metoprolol Succinate 25 MG CS24 Take one tablet by mouth once daily.   Multiple Vitamin (MULTIVITAMIN) tablet Take 1 tablet by mouth daily.   pantoprazole (PROTONIX) 40 MG tablet Take 1 tablet (40 mg total) by mouth daily.   pneumococcal 20-valent conjugate vaccine (PREVNAR 20) 0.5 ML injection Inject into the muscle.   rivastigmine (EXELON) 13.3 MG/24HR Place 1 patch (13.3 mg total) onto the skin daily.   tamsulosin (FLOMAX) 0.4 MG CAPS capsule Take 1 capsule (0.4 mg total) by mouth daily.   [DISCONTINUED] tamsulosin (FLOMAX) 0.4 MG CAPS capsule Take 1 capsule (0.4 mg total) by mouth daily. (Patient not taking: Reported on 10/29/2022)   No facility-administered encounter medications on file as of 10/29/2022.    Review of Systems:  Review of Systems  Constitutional:  Negative for activity change, appetite change, chills, diaphoresis, fatigue, fever and unexpected weight change.  Respiratory:  Negative for cough, shortness of breath, wheezing and stridor.   Cardiovascular:  Negative for chest pain, palpitations and leg swelling.  Gastrointestinal:  Negative for abdominal distention, abdominal pain, constipation and diarrhea.  Genitourinary:  Negative for difficulty urinating and dysuria.  Musculoskeletal:  Positive for gait problem (shuffles). Negative for arthralgias, back pain, joint swelling and myalgias.  Neurological:  Negative for dizziness, seizures, syncope, facial asymmetry, speech difficulty, weakness and headaches.       Aphasia memory loss.   Hematological:  Negative for adenopathy. Does not  bruise/bleed easily.  Psychiatric/Behavioral:  Positive for confusion. Negative for agitation and behavioral problems.     Health Maintenance  Topic Date Due   Medicare Annual Wellness (AWV)  03/13/2019   DTaP/Tdap/Td (2 - Tdap) 07/09/2020   COVID-19 Vaccine (5 - 2023-24 season) 03/09/2022   INFLUENZA VACCINE  02/07/2023   Pneumonia Vaccine 96+ Years old  Completed   Zoster Vaccines- Shingrix  Completed   HPV VACCINES  Aged Out    Physical Exam: Vitals:   10/29/22 1319  BP: 138/82  Pulse: (!) 59  Resp: 16  Temp: 98 F (36.7 C)  TempSrc: Temporal  SpO2: 99%  Weight: 194 lb 6.4 oz (88.2 kg)  Height: 5\' 9"  (1.753 m)   Body mass index is 28.71 kg/m. Physical Exam Vitals and nursing note reviewed.  Constitutional:      General: He is not in acute distress.    Appearance: He is not diaphoretic.  HENT:     Head: Normocephalic and atraumatic.     Right Ear: Tympanic membrane normal.     Left Ear: Tympanic membrane normal.     Nose: Nose normal.     Mouth/Throat:     Mouth: Mucous membranes are moist.     Pharynx: Oropharynx is clear.  Eyes:     Conjunctiva/sclera: Conjunctivae normal.     Pupils: Pupils are equal, round, and reactive to light.  Neck:     Thyroid: No thyromegaly.     Vascular: No JVD.     Trachea: No tracheal deviation.  Cardiovascular:     Rate and Rhythm: Normal rate and regular rhythm.     Heart sounds: No murmur heard. Pulmonary:     Effort: Pulmonary effort is normal. No respiratory distress.     Breath sounds: Normal breath sounds. No wheezing.  Abdominal:     General: Bowel sounds are normal. There is no distension.     Palpations: Abdomen is soft.     Tenderness: There is no abdominal tenderness. There is no guarding.  Musculoskeletal:     Cervical back: Normal range of motion and neck supple.     Right lower leg: No edema.     Left lower leg: No edema.  Lymphadenopathy:     Cervical: No cervical adenopathy.  Skin:    General: Skin is  warm and dry.  Neurological:     Mental Status: He is alert and oriented to person, place, and time.     Cranial Nerves: No cranial nerve deficit.  Psychiatric:        Mood and Affect: Mood normal.     Labs reviewed: Basic Metabolic Panel: Recent Labs    03/01/22 0000 04/10/22 0000 10/23/22 0000  NA 136* 136* 137  K 4.7 4.9 4.6  CL 97* 99 100  CO2 26* 27* 26*  BUN 15 13 13   CREATININE 1.2 1.2 1.1  CALCIUM 10.0 10.0 9.6  TSH 1.16  --   --  Liver Function Tests: Recent Labs    03/01/22 0000 04/10/22 0000 10/23/22 0000  AST 51* 57* 46*  ALT 37 40 32  ALKPHOS 109 118 112  ALBUMIN 4.3 4.4 4.3   No results for input(s): "LIPASE", "AMYLASE" in the last 8760 hours. No results for input(s): "AMMONIA" in the last 8760 hours. CBC: Recent Labs    10/23/22 0000 10/25/22 0000  WBC 2.4 2.3  NEUTROABS  --  1.20  HGB 14.1 13.9  HCT 41  --   PLT 65* 68*   Lipid Panel: Recent Labs    03/01/22 0000  CHOL 200  HDL 85*  LDLCALC 96  TRIG 95   No results found for: "HGBA1C"  Procedures since last visit: No results found.  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 Leukopenia, unspecified type As above  3. Essential hypertension Controlled  Continue metoprolol   4. Hyperlipidemia, unspecified hyperlipidemia type Lab Results  Component Value Date   LDLCALC 96 03/01/2022    5. Benign prostatic hyperplasia without lower urinary tract symptoms Followed by urology Dr Annabell Howells Continue Flomax.   6. GERD without esophagitis Continue protonix.   7. Moderate to severe Alz dementia Progressing over time On namenda and exelon Would qualify for memory care when his wife is ready to consider Currently no wandering or concerning behaviors.   Labs/tests ordered:  * No order type specified * labs to be done at hematology   Next appt:  4 months   Recommend spring covid booster.   Total time 30mn:  time greater  than 50% of total time spent doing pt counseling and coordination of care

## 2022-11-01 DIAGNOSIS — Z23 Encounter for immunization: Secondary | ICD-10-CM | POA: Diagnosis not present

## 2022-11-10 ENCOUNTER — Other Ambulatory Visit (HOSPITAL_BASED_OUTPATIENT_CLINIC_OR_DEPARTMENT_OTHER): Payer: Self-pay

## 2022-11-12 ENCOUNTER — Other Ambulatory Visit: Payer: Self-pay | Admitting: *Deleted

## 2022-11-12 DIAGNOSIS — D696 Thrombocytopenia, unspecified: Secondary | ICD-10-CM

## 2022-11-13 ENCOUNTER — Inpatient Hospital Stay: Payer: Medicare Other | Attending: Nurse Practitioner | Admitting: Nurse Practitioner

## 2022-11-13 ENCOUNTER — Encounter: Payer: Self-pay | Admitting: Nurse Practitioner

## 2022-11-13 ENCOUNTER — Inpatient Hospital Stay: Payer: Medicare Other

## 2022-11-13 ENCOUNTER — Telehealth: Payer: Self-pay

## 2022-11-13 VITALS — BP 150/61 | HR 63 | Temp 98.1°F | Resp 18 | Ht 69.0 in | Wt 194.2 lb

## 2022-11-13 DIAGNOSIS — F0280 Dementia in other diseases classified elsewhere without behavioral disturbance: Secondary | ICD-10-CM | POA: Diagnosis not present

## 2022-11-13 DIAGNOSIS — G309 Alzheimer's disease, unspecified: Secondary | ICD-10-CM | POA: Diagnosis not present

## 2022-11-13 DIAGNOSIS — I10 Essential (primary) hypertension: Secondary | ICD-10-CM | POA: Insufficient documentation

## 2022-11-13 DIAGNOSIS — D709 Neutropenia, unspecified: Secondary | ICD-10-CM | POA: Diagnosis not present

## 2022-11-13 DIAGNOSIS — N400 Enlarged prostate without lower urinary tract symptoms: Secondary | ICD-10-CM | POA: Diagnosis not present

## 2022-11-13 DIAGNOSIS — Z87891 Personal history of nicotine dependence: Secondary | ICD-10-CM | POA: Diagnosis not present

## 2022-11-13 DIAGNOSIS — Z79899 Other long term (current) drug therapy: Secondary | ICD-10-CM | POA: Insufficient documentation

## 2022-11-13 DIAGNOSIS — D696 Thrombocytopenia, unspecified: Secondary | ICD-10-CM

## 2022-11-13 LAB — CBC WITH DIFFERENTIAL (CANCER CENTER ONLY)
Abs Immature Granulocytes: 0.01 10*3/uL (ref 0.00–0.07)
Basophils Absolute: 0 10*3/uL (ref 0.0–0.1)
Basophils Relative: 0 %
Eosinophils Absolute: 0 10*3/uL (ref 0.0–0.5)
Eosinophils Relative: 0 %
HCT: 42.3 % (ref 39.0–52.0)
Hemoglobin: 14.2 g/dL (ref 13.0–17.0)
Immature Granulocytes: 0 %
Lymphocytes Relative: 27 %
Lymphs Abs: 0.7 10*3/uL (ref 0.7–4.0)
MCH: 29.8 pg (ref 26.0–34.0)
MCHC: 33.6 g/dL (ref 30.0–36.0)
MCV: 88.9 fL (ref 80.0–100.0)
Monocytes Absolute: 0.3 10*3/uL (ref 0.1–1.0)
Monocytes Relative: 11 %
Neutro Abs: 1.5 10*3/uL — ABNORMAL LOW (ref 1.7–7.7)
Neutrophils Relative %: 62 %
Platelet Count: 73 10*3/uL — ABNORMAL LOW (ref 150–400)
RBC: 4.76 MIL/uL (ref 4.22–5.81)
RDW: 13.2 % (ref 11.5–15.5)
WBC Count: 2.5 10*3/uL — ABNORMAL LOW (ref 4.0–10.5)
nRBC: 0 % (ref 0.0–0.2)

## 2022-11-13 LAB — CMP (CANCER CENTER ONLY)
ALT: 31 U/L (ref 0–44)
AST: 47 U/L — ABNORMAL HIGH (ref 15–41)
Albumin: 4.4 g/dL (ref 3.5–5.0)
Alkaline Phosphatase: 88 U/L (ref 38–126)
Anion gap: 7 (ref 5–15)
BUN: 12 mg/dL (ref 8–23)
CO2: 31 mmol/L (ref 22–32)
Calcium: 9.9 mg/dL (ref 8.9–10.3)
Chloride: 97 mmol/L — ABNORMAL LOW (ref 98–111)
Creatinine: 1.11 mg/dL (ref 0.61–1.24)
GFR, Estimated: >60 mL/min (ref >60)
Glucose, Bld: 121 mg/dL — ABNORMAL HIGH (ref 70–99)
Potassium: 4.4 mmol/L (ref 3.5–5.1)
Sodium: 135 mmol/L (ref 135–145)
Total Bilirubin: 1.6 mg/dL — ABNORMAL HIGH (ref 0.3–1.2)
Total Protein: 7.5 g/dL (ref 6.5–8.1)

## 2022-11-13 LAB — VITAMIN B12: Vitamin B-12: 1094 pg/mL — ABNORMAL HIGH (ref 180–914)

## 2022-11-13 LAB — LACTATE DEHYDROGENASE: LDH: 164 U/L (ref 98–192)

## 2022-11-13 LAB — SAVE SMEAR(SSMR), FOR PROVIDER SLIDE REVIEW

## 2022-11-13 NOTE — Telephone Encounter (Signed)
I had a discussion with Dondra Spry at [facility] regarding obtaining the patient's CBC results from the last 5 years. She informed me that a release form needs to be signed by the patient in order for [place] to receive the records. I reached out to the patient's wife and advised her of the necessity of a medical release form for [place] to access his medication records. She has agreed to visit the office tomorrow to sign the necessary documentation.

## 2022-11-13 NOTE — Progress Notes (Signed)
New Hematology/Oncology Consult   Requesting MD: Fletcher Anon, NP  [phone]      Reason for Consult: Thrombocytopenia  HPI: Noah Hughes is an 88 year old man referred for evaluation of thrombocytopenia.  CBC completed 10/23/2022 showed a platelet count of 65,000, hemoglobin 14.1, WBC 2.4.  Repeat CBC 10/25/2022-platelet count 68,000, hemoglobin 13.9, WBC 2.3, ANC 1.2.  No other CBCs available in the EMR for comparison.  Noah Hughes is accompanied by his wife and daughter.  They give a diagnosis of Alzheimer's dementia and provide the majority of his past medical information.  He was previously followed by Dr. Timothy Lasso, now lives at [facility] followed by Dr. Chales Abrahams.  He has occasional hematuria.  He is followed by urology.  No fevers or sweats.  He has a good appetite.  No weight loss.  His wife reports liver test have been abnormal in the past.     Past Medical History:  Diagnosis Date   BPH (benign prostatic hyperplasia)    Chronic kidney disease    Dementia (HCC)    GERD (gastroesophageal reflux disease)    Hyperlipidemia    Hypertension      Past Surgical History:  Procedure Laterality Date   TRANSURETHRAL RESECTION OF PROSTATE       Current Outpatient Medications:    B Complex Vitamins (VITAMIN B COMPLEX) TABS, Take 1 tablet by mouth daily., Disp: , Rfl:    memantine (NAMENDA) 10 MG tablet, Take 10 mg by mouth 2 (two) times daily., Disp: , Rfl:    Metoprolol Succinate 25 MG CS24, Take one tablet by mouth once daily., Disp: , Rfl:    Multiple Vitamin (MULTIVITAMIN) tablet, Take 1 tablet by mouth daily., Disp: , Rfl:    pantoprazole (PROTONIX) 40 MG tablet, Take 1 tablet (40 mg total) by mouth daily., Disp: 90 tablet, Rfl: 1   pneumococcal 20-valent conjugate vaccine (PREVNAR 20) 0.5 ML injection, Inject into the muscle., Disp: 0.5 mL, Rfl: 0   rivastigmine (EXELON) 13.3 MG/24HR, Place 1 patch (13.3 mg total) onto the skin daily., Disp: 30 patch, Rfl: 5   tamsulosin  (FLOMAX) 0.4 MG CAPS capsule, Take 1 capsule (0.4 mg total) by mouth daily., Disp: 30 capsule, Rfl: 11   influenza vaccine adjuvanted (FLUAD QUADRIVALENT) 0.5 ML injection, Inject into the muscle. (Patient not taking: Reported on 11/13/2022), Disp: 0.5 mL, Rfl: 0:    No Known Allergies:  FH: Sister with non-Hodgkin's lymphoma.  SOCIAL HISTORY: He is married.  Currently lives at [facility].  His wife reports he is being moved to the memory care unit in the near future.  He is retired from [employer].  He quit smoking in 1972.  He drinks 2 to 3 glasses of wine a day.  His wife estimates he has been drinking for 40+ years, heavier in the past.  Review of Systems: No pain.  No unusual headaches or vision change.  No dysphagia.  His wife reports he underwent esophageal dilatation in the remote past.  No shortness of breath or cough.  He has diarrhea about every 10 days for the past 6 months.  No bloody or black stools.  No numbness or tingling in the hands or feet.  Physical Exam:  Blood pressure (!) 150/61, pulse 63, temperature 98.1 F (36.7 C), resp. rate 18, height 5\' 9"  (1.753 m), weight 194 lb 3.2 oz (88.1 kg), SpO2 98 %.  HEENT: No thrush or ulcers. Lungs: Lungs clear bilaterally. Cardiac: Regular rate and rhythm. Abdomen: Abdomen is soft, protuberant.  No apparent ascites.  Prominent vein pattern over the abdominal wall.  No hepatosplenomegaly. Vascular: No leg edema. Lymph nodes: No palpable cervical, supraclavicular, axillary or inguinal lymph nodes. Neurologic: Alert.  Follows some commands.  Limited verbal interaction. Skin: Fungal appearing rash bilateral axilla and groin.  Dry erythematous rash on the back.  LABS:   Recent Labs    11/13/22 1056  WBC 2.5*  HGB 14.2  HCT 42.3  PLT 73*  Peripheral blood smear-platelets appear mildly decreased; there are neutrophils and lymphocytes, several atypical lymphocytes with granules, no monotonous white cell population, no blasts  or other young forms; ovalocytes, polychromasia not increased.  No results for input(s): "NA", "K", "CL", "CO2", "GLUCOSE", "BUN", "CREATININE", "CALCIUM" in the last 72 hours.    RADIOLOGY:  No results found.  Assessment and Plan:   Thrombocytopenia, neutropenia Significant EtOH intake Dementia Hypertension BPH  Noah Hughes was referred for evaluation of thrombocytopenia and neutropenia.  We discussed potential etiologies including MDS, lymphoproliferative process, alcohol use/chronic liver disease.  We are obtaining additional laboratory evaluation to include a B12 level, myeloma panel, chemistry panel, LDH.  Neutropenic and thrombocytopenic precautions reviewed with his family.  They understand to seek evaluation for signs of infection, bleeding.  We have contacted Dr. Ferd Hibbs office for CBCs over the past 5 years.  Noah Hughes will return for lab and follow-up in 3 to 4 months.  Patient seen with Dr. Truett Perna.  Lonna Cobb, NP 11/13/2022, 12:52 PM This was a shared visit with Lonna Cobb.  Noah Hughes was interviewed and examined.  I reviewed the peripheral blood smear.  He is referred for evaluation of neutropenia and thrombocytopenia.  We have requested remote CBC data from Dr. Timothy Lasso.  The cytopenias are most likely related to heavy use and potentially alcohol induced liver disease.  There are atypical granular lymphocytes on the peripheral blood smear, but he does not have an absolute lymphocytosis.  Differential diagnosis includes myelodysplasia and less likely a metaplastic malignancy.  We obtained additional diagnostic studies today.  Noah Hughes appears to have severe dementia.  He does not appear to be a candidate for a bone marrow biopsy or aggressive interventions.  His family agrees.  Dr. Chales Abrahams consider evaluation for cirrhosis.  I was present for greater than 50% of today's visit.  I performed medical decision making.  Mancel Bale, MD

## 2022-11-14 ENCOUNTER — Other Ambulatory Visit (HOSPITAL_BASED_OUTPATIENT_CLINIC_OR_DEPARTMENT_OTHER): Payer: Self-pay

## 2022-11-14 MED ORDER — BOOSTRIX 5-2.5-18.5 LF-MCG/0.5 IM SUSY
0.5000 mL | PREFILLED_SYRINGE | INTRAMUSCULAR | 0 refills | Status: DC
  Filled 2022-11-14: qty 0.5, 1d supply, fill #0

## 2022-11-16 ENCOUNTER — Telehealth: Payer: Self-pay

## 2022-11-16 NOTE — Telephone Encounter (Signed)
-----   Message from Rana Snare, NP sent at 11/16/2022  9:49 AM EDT ----- Please let his wife know we received old labs from Dr. Ferd Hibbs office.  The leukopenia and thrombocytopenia were present on blood work dated as far back as 09/28/2017 (white count 2.12, platelet count 80,000), very similar to the CBC we did earlier this week.  Follow-up as scheduled.

## 2022-11-16 NOTE — Telephone Encounter (Signed)
Patient's wife gave verbal understanding and had no further question or concern

## 2022-11-19 ENCOUNTER — Other Ambulatory Visit (HOSPITAL_BASED_OUTPATIENT_CLINIC_OR_DEPARTMENT_OTHER): Payer: Self-pay

## 2022-11-19 ENCOUNTER — Encounter: Payer: Self-pay | Admitting: Adult Health

## 2022-11-19 ENCOUNTER — Non-Acute Institutional Stay: Payer: Medicare Other | Admitting: Adult Health

## 2022-11-19 VITALS — BP 150/78 | HR 60 | Temp 98.2°F | Resp 17 | Ht 69.0 in | Wt 195.6 lb

## 2022-11-19 DIAGNOSIS — G301 Alzheimer's disease with late onset: Secondary | ICD-10-CM

## 2022-11-19 DIAGNOSIS — H1031 Unspecified acute conjunctivitis, right eye: Secondary | ICD-10-CM | POA: Diagnosis not present

## 2022-11-19 DIAGNOSIS — B354 Tinea corporis: Secondary | ICD-10-CM

## 2022-11-19 DIAGNOSIS — B356 Tinea cruris: Secondary | ICD-10-CM

## 2022-11-19 DIAGNOSIS — F02B0 Dementia in other diseases classified elsewhere, moderate, without behavioral disturbance, psychotic disturbance, mood disturbance, and anxiety: Secondary | ICD-10-CM | POA: Diagnosis not present

## 2022-11-19 MED ORDER — ERYTHROMYCIN 5 MG/GM OP OINT
1.0000 | TOPICAL_OINTMENT | Freq: Three times a day (TID) | OPHTHALMIC | 0 refills | Status: DC
Start: 2022-11-19 — End: 2022-12-13
  Filled 2022-11-19: qty 3.5, 30d supply, fill #0

## 2022-11-19 MED ORDER — FLUCONAZOLE 150 MG PO TABS
150.0000 mg | ORAL_TABLET | ORAL | 0 refills | Status: DC
Start: 2022-11-19 — End: 2022-11-27
  Filled 2022-11-19: qty 2, 7d supply, fill #0

## 2022-11-19 MED ORDER — LORAZEPAM 0.5 MG PO TABS
0.2500 mg | ORAL_TABLET | Freq: Two times a day (BID) | ORAL | 0 refills | Status: DC | PRN
Start: 2022-11-19 — End: 2022-11-27
  Filled 2022-11-19: qty 20, 20d supply, fill #0

## 2022-11-19 NOTE — Progress Notes (Addendum)
[facility]   [facility]   Provider:  Peggye Ley, ANP [facility] [phone]   Code Status:  Goals of Care:     11/19/2022    3:15 PM  Advanced Directives  Does Patient Have a Medical Advance Directive? Yes  Type of Estate agent of [place];Living will;Out of facility DNR (pink MOST or yellow form)  Does patient want to make changes to medical advance directive? No - Patient declined  Copy of Healthcare Power of Attorney in Chart? No - copy requested  Would patient like information on creating a medical advance directive? No - Patient declined     Chief Complaint  Patient presents with   Acute Visit    Patient is being seen today for a rash on his body and would like it looked at on his chest and back    HPI: Patient is a 88 y.o. male seen today for an acute visit for rash.   He has an itchy rash to the groin, under arms, trunk, and back noted for the past week. No new detergent or meds  Also has some right eye purulent drainage. Has dry eyes and has been using drops for moisture. Now having drainage. No pain or change in vision.   He has underlying dementia which is progressing and also aphasia. He is moving to skilled memory care on 5/16.  His wife is noticing some increased agitation in the home and is concerned that this will worsen in the memory care setting. She is requesting medication to help with the transition. He recently saw hematology for leukopenia and thrombocytopenia. The differential includes MDS vs cirrhosis.  He is not currently having any symptoms of bruising or bleeding.   Past Medical History:  Diagnosis Date   BPH (benign prostatic hyperplasia)    Chronic kidney disease    Dementia (HCC)    GERD (gastroesophageal reflux disease)    Hyperlipidemia    Hypertension     Past Surgical History:  Procedure Laterality Date   TRANSURETHRAL RESECTION OF PROSTATE      No Known  Allergies  Outpatient Encounter Medications as of 11/19/2022  Medication Sig   erythromycin ophthalmic ointment Place 1 Application into both eyes 3 (three) times daily for 7 days.   fluconazole (DIFLUCAN) 150 MG tablet Take 1 tablet (150 mg total) by mouth 2 (two) times a week for one week.   LORazepam (ATIVAN) 0.5 MG tablet Take 0.5 tablets (0.25 mg total) by mouth 2 (two) times daily as needed for anxiety.   B Complex Vitamins (VITAMIN B COMPLEX) TABS Take 1 tablet by mouth daily.   memantine (NAMENDA) 10 MG tablet Take 10 mg by mouth 2 (two) times daily.   Metoprolol Succinate 25 MG CS24 Take one tablet by mouth once daily.   Multiple Vitamin (MULTIVITAMIN) tablet Take 1 tablet by mouth daily.   pantoprazole (PROTONIX) 40 MG tablet Take 1 tablet (40 mg total) by mouth daily.   pneumococcal 20-valent conjugate vaccine (PREVNAR 20) 0.5 ML injection Inject into the muscle.   rivastigmine (EXELON) 13.3 MG/24HR Place 1 patch (13.3 mg total) onto the skin daily.   tamsulosin (FLOMAX) 0.4 MG CAPS capsule Take 1 capsule (0.4 mg total) by mouth daily.   Tdap (BOOSTRIX) 5-2.5-18.5 LF-MCG/0.5 injection Inject 0.5 mLs into the muscle.   [DISCONTINUED] influenza vaccine adjuvanted (FLUAD QUADRIVALENT) 0.5 ML injection Inject into the muscle. (Patient not taking: Reported on 11/13/2022)   No facility-administered encounter medications  on file as of 11/19/2022.    Review of Systems:  Review of Systems  Unable to perform ROS: Dementia    Health Maintenance  Topic Date Due   Medicare Annual Wellness (AWV)  12/20/2022 (Originally 03/13/2019)   COVID-19 Vaccine (6 - 2023-24 season) 12/28/2022   INFLUENZA VACCINE  02/07/2023   DTaP/Tdap/Td (3 - Td or Tdap) 11/13/2032   Pneumonia Vaccine 34+ Years old  Completed   Zoster Vaccines- Shingrix  Completed   HPV VACCINES  Aged Out    Physical Exam: Vitals:   11/19/22 1532 11/19/22 1535  BP: (!) 150/82 (!) 150/78  Pulse: 60   Resp: 17   Temp: 98.2 F  (36.8 C)   TempSrc: Temporal   SpO2: 96%   Weight: 195 lb 9.6 oz (88.7 kg)   Height: 5\' 9"  (1.753 m)    Body mass index is 28.89 kg/m. Physical Exam Vitals and nursing note reviewed.  Constitutional:      General: He is not in acute distress.    Appearance: He is not diaphoretic.  HENT:     Head: Normocephalic and atraumatic.  Eyes:     General:        Right eye: Discharge (yellow) present.        Left eye: No discharge.     Pupils: Pupils are equal, round, and reactive to light.     Comments: Right eye conjunctiva and sclera with erythema  Neck:     Thyroid: No thyromegaly.     Vascular: No JVD.     Trachea: No tracheal deviation.  Cardiovascular:     Rate and Rhythm: Normal rate and regular rhythm.     Heart sounds: No murmur heard. Pulmonary:     Effort: Pulmonary effort is normal. No respiratory distress.     Breath sounds: Wheezing present.  Abdominal:     General: Bowel sounds are normal. There is no distension.     Palpations: Abdomen is soft.     Tenderness: There is no abdominal tenderness.  Lymphadenopathy:     Cervical: No cervical adenopathy.  Skin:    General: Skin is warm and dry.     Findings: Rash (noted to groin, buttocks, chest, back, arms, axilla macular erythematous) present.  Neurological:     Mental Status: He is alert. Mental status is at baseline.     Comments: aphasia  Psychiatric:        Mood and Affect: Mood normal.     Labs reviewed: Basic Metabolic Panel: Recent Labs    03/01/22 0000 04/10/22 0000 10/23/22 0000 11/13/22 1319  NA 136* 136* 137 135  K 4.7 4.9 4.6 4.4  CL 97* 99 100 97*  CO2 26* 27* 26* 31  GLUCOSE  --   --   --  121*  BUN 15 13 13 12   CREATININE 1.2 1.2 1.1 1.11  CALCIUM 10.0 10.0 9.6 9.9  TSH 1.16  --   --   --    Liver Function Tests: Recent Labs    04/10/22 0000 10/23/22 0000 11/13/22 1319  AST 57* 46* 47*  ALT 40 32 31  ALKPHOS 118 112 88  BILITOT  --   --  1.6*  PROT  --   --  7.5  ALBUMIN  4.4 4.3 4.4   No results for input(s): "LIPASE", "AMYLASE" in the last 8760 hours. No results for input(s): "AMMONIA" in the last 8760 hours. CBC: Recent Labs    10/23/22 0000 10/25/22 0000 11/13/22 1056  WBC 2.4 2.3 2.5*  NEUTROABS  --  1.20 1.5*  HGB 14.1 13.9 14.2  HCT 41  --  42.3  MCV  --   --  88.9  PLT 65* 68* 73*   Lipid Panel: Recent Labs    03/01/22 0000  CHOL 200  HDL 85*  LDLCALC 96  TRIG 95   No results found for: "HGBA1C"  Procedures since last visit: No results found.  Assessment/Plan 1. Tinea cruris  - fluconazole (DIFLUCAN) 150 MG tablet; Take 1 tablet (150 mg total) by mouth 2 (two) times a week for one week.  Dispense: 2 tablet; Refill: 0  Will f/u later this week when he moves into to see if it is improving.   2. Moderate late onset Alzheimer's dementia without behavioral disturbance, psychotic disturbance, mood disturbance, or anxiety (HCC) To help transition with the move to memory care. Having some increased agitation at home - LORazepam (ATIVAN) 0.5 MG tablet; Take 0.5 tablets (0.25 mg total) by mouth 2 (two) times daily as needed for anxiety.  Dispense: 20 tablet; Refill: 0  3. Acute bacterial conjunctivitis of right eye  - erythromycin ophthalmic ointment; Place 1 Application into both eyes 3 (three) times daily for 7 days.  Dispense: 21 g; Refill: 0  4. Thrombocytopenia He is moving into memory care with progressive dementia and will be having a f/u with Dr Chales Abrahams. At that time it can be determined if any further work up is warranted for this problem in light of his overall health and goals of care.   Labs/tests ordered:  * No order type specified * Next appt:  02/26/2023   Total time 30:  time greater than 50% of total time spent doing pt counseling and coordination of care

## 2022-11-20 ENCOUNTER — Encounter: Payer: Self-pay | Admitting: Nurse Practitioner

## 2022-11-20 LAB — MULTIPLE MYELOMA PANEL, SERUM
Albumin SerPl Elph-Mcnc: 3.9 g/dL (ref 2.9–4.4)
Albumin/Glob SerPl: 1.4 (ref 0.7–1.7)
Alpha 1: 0.3 g/dL (ref 0.0–0.4)
Alpha2 Glob SerPl Elph-Mcnc: 0.5 g/dL (ref 0.4–1.0)
B-Globulin SerPl Elph-Mcnc: 0.8 g/dL (ref 0.7–1.3)
Gamma Glob SerPl Elph-Mcnc: 1.3 g/dL (ref 0.4–1.8)
Globulin, Total: 2.9 g/dL (ref 2.2–3.9)
IgA: 154 mg/dL (ref 61–437)
IgG (Immunoglobin G), Serum: 1201 mg/dL (ref 603–1613)
IgM (Immunoglobulin M), Srm: 186 mg/dL — ABNORMAL HIGH (ref 15–143)
Total Protein ELP: 6.8 g/dL (ref 6.0–8.5)

## 2022-11-22 ENCOUNTER — Telehealth: Payer: Self-pay | Admitting: *Deleted

## 2022-11-22 NOTE — Telephone Encounter (Signed)
We given an order for wine but it can not be used at the same time as the ativan. Will discuss with the nurse.   Thanks  Peggye Ley NP

## 2022-11-22 NOTE — Telephone Encounter (Signed)
Noah Hughes, Wife called and stated that patient was seen yesterday by Sherene Sires. Stated that patient is moving into Memory care and they told her that patient will need a written order for patient to Drink wine.   Wife is requesting an order to be written for patient to drink 1-2 glasses/day if that is ok with Provider.   Requesting the order to be given to Memory Care.

## 2022-11-26 ENCOUNTER — Non-Acute Institutional Stay (SKILLED_NURSING_FACILITY): Payer: Medicare Other | Admitting: Adult Health

## 2022-11-26 ENCOUNTER — Encounter: Payer: Self-pay | Admitting: Adult Health

## 2022-11-26 DIAGNOSIS — F02B11 Dementia in other diseases classified elsewhere, moderate, with agitation: Secondary | ICD-10-CM

## 2022-11-26 DIAGNOSIS — D709 Neutropenia, unspecified: Secondary | ICD-10-CM | POA: Diagnosis not present

## 2022-11-26 DIAGNOSIS — H1031 Unspecified acute conjunctivitis, right eye: Secondary | ICD-10-CM | POA: Diagnosis not present

## 2022-11-26 DIAGNOSIS — E782 Mixed hyperlipidemia: Secondary | ICD-10-CM | POA: Diagnosis not present

## 2022-11-26 DIAGNOSIS — D696 Thrombocytopenia, unspecified: Secondary | ICD-10-CM | POA: Diagnosis not present

## 2022-11-26 DIAGNOSIS — G301 Alzheimer's disease with late onset: Secondary | ICD-10-CM

## 2022-11-26 DIAGNOSIS — N400 Enlarged prostate without lower urinary tract symptoms: Secondary | ICD-10-CM

## 2022-11-26 DIAGNOSIS — I10 Essential (primary) hypertension: Secondary | ICD-10-CM

## 2022-11-26 DIAGNOSIS — B356 Tinea cruris: Secondary | ICD-10-CM | POA: Diagnosis not present

## 2022-11-26 NOTE — Progress Notes (Unsigned)
Location:  [facility] Room Number: 311A Place of Service:  Clinic [id number]) Provider:  Tamsen Roers, MD  Patient Care Team: Mahlon Gammon, MD as PCP - General (Internal Medicine) Sinda Du, MD as Consulting Physician (Ophthalmology) Raeanne Barry, DMD (Dentistry)  Extended Emergency Contact Information Primary Emergency Contact: o'brien,mary Mobile Phone: [phone] Relation: Spouse Preferred language: English Secondary Emergency Contact: ROBINSON,JULIA Mobile Phone: [phone] Relation: Daughter Interpreter needed? No  Code Status:  DNR Goals of care: Advanced Directive information    11/26/2022    9:21 AM  Advanced Directives  Does Patient Have a Medical Advance Directive? Yes  Type of Estate agent of [place];Living will;Out of facility DNR (pink MOST or yellow form)  Does patient want to make changes to medical advance directive? No - Patient declined  Copy of Healthcare Power of Attorney in Chart? No - copy requested  Would patient like information on creating a medical advance directive? No - Patient declined     Chief Complaint  Patient presents with   Acute Visit    Patient is being seen for acute agitation    HPI:  Pt is a 88 y.o. male seen today for an acute visit for agitation. He was admitted to the memory care unit due to progressive dementia on 5/17.  He is ambulatory and able to help dress himself but resists/avoid bathing and personal care. He has incontinence and wears a brief. Has aphasia but does still verbalize at times. Intermittently able to f/c.  He can become easily angered. Nursing was ordered ativan and note that it has been effective to help with these behaviors. They have not been successful in getting him to take a bath yet. He is not hitting others or a danger to others. Still adjusting to the memory care unit. MMSE 3/30 on admit. Prior to living in the  memory care unit he lived with his wife in [place] as his primary care taker.  He had a fungal rash which was disseminated and was given Diflucan on 5/13 and it has mostly resolved. He also had some conjunctivitis and was given erythromycin. This also has improved.  IN addition he has had low white count and low platelets. He was referred to hematology and seen by Dr Truett Perna on 11/13/22.  He was not felt to be a candidate for bone marrow bx. The differential includes myelodysplasia and cirrhosis due to heavy alcohol use.  He has no current symptoms   Past Medical History:  Diagnosis Date   BPH (benign prostatic hyperplasia)    Chronic kidney disease    Dementia (HCC)    GERD (gastroesophageal reflux disease)    Hyperlipidemia    Hypertension    Past Surgical History:  Procedure Laterality Date   TRANSURETHRAL RESECTION OF PROSTATE      No Known Allergies  Outpatient Encounter Medications as of 11/26/2022  Medication Sig   B Complex Vitamins (VITAMIN B COMPLEX) TABS Take 1 tablet by mouth daily.   erythromycin ophthalmic ointment Place 1 Application into both eyes 3 (three) times daily for 7 days.   LORazepam (ATIVAN) 0.5 MG tablet Take 0.5 tablets (0.25 mg total) by mouth 2 (two) times daily as needed for anxiety.   memantine (NAMENDA) 10 MG tablet Take 10 mg by mouth 2 (two) times daily.   Metoprolol Succinate 25 MG CS24 Take one tablet by mouth once daily.   Multiple Vitamin (MULTIVITAMIN) tablet Take 1 tablet by mouth  daily.   pantoprazole (PROTONIX) 40 MG tablet Take 1 tablet (40 mg total) by mouth daily.   pneumococcal 20-valent conjugate vaccine (PREVNAR 20) 0.5 ML injection Inject into the muscle.   rivastigmine (EXELON) 13.3 MG/24HR Place 1 patch (13.3 mg total) onto the skin daily.   tamsulosin (FLOMAX) 0.4 MG CAPS capsule Take 1 capsule (0.4 mg total) by mouth daily.   Tdap (BOOSTRIX) 5-2.5-18.5 LF-MCG/0.5 injection Inject 0.5 mLs into the muscle.   fluconazole (DIFLUCAN) 150  MG tablet Take 1 tablet (150 mg total) by mouth 2 (two) times a week for one week. (Patient not taking: Reported on 11/26/2022)   No facility-administered encounter medications on file as of 11/26/2022.    Review of Systems  Unable to perform ROS: Dementia    Immunization History  Administered Date(s) Administered   Fluad Quad(high Dose 65+) 04/10/2022   Influenza-Unspecified 05/06/2020   Moderna Covid-19 Vaccine Bivalent Booster 55yrs & up 11/02/2022   Moderna SARS-COV2 Booster Vaccination 11/04/2020, 12/11/2021   Moderna Sars-Covid-2 Vaccination 07/21/2019, 08/18/2019, 05/25/2020, 04/21/2021   PNEUMOCOCCAL CONJUGATE-20 05/02/2022   Pneumococcal-Unspecified 06/22/2014   Td 07/09/2010   Tdap 11/14/2022   Zoster Recombinat (Shingrix) 07/09/2010, 05/09/2018   Pertinent  Health Maintenance Due  Topic Date Due   INFLUENZA VACCINE  02/07/2023      04/24/2022    9:21 AM 10/29/2022    1:18 PM 11/19/2022    3:34 PM  Fall Risk  Falls in the past year? 0 0 0  Was there an injury with Fall? 0 0 0  Fall Risk Category Calculator 0 0 0  Fall Risk Category (Retired) Low    (RETIRED) Patient Fall Risk Level Low fall risk    Patient at Risk for Falls Due to No Fall Risks No Fall Risks No Fall Risks  Fall risk Follow up Falls evaluation completed  Falls evaluation completed   Functional Status Survey:    Vitals:   11/26/22 0920  BP: (!) 146/66  Pulse: 60  Resp: 18  Temp: (!) 97 F (36.1 C)  TempSrc: Temporal  SpO2: 96%  Weight: 191 lb 3.2 oz (86.7 kg)  Height: 5\' 9"  (1.753 m)   Body mass index is 28.24 kg/m. Physical Exam Constitutional:      General: He is not in acute distress.    Appearance: Normal appearance. He is not diaphoretic.  HENT:     Head: Normocephalic and atraumatic.     Right Ear: There is impacted cerumen.     Left Ear: Tympanic membrane, ear canal and external ear normal. There is no impacted cerumen.     Nose: Nose normal.     Mouth/Throat:     Mouth:  Mucous membranes are moist.     Pharynx: Oropharynx is clear.  Eyes:     Conjunctiva/sclera: Conjunctivae normal.     Pupils: Pupils are equal, round, and reactive to light.  Neck:     Thyroid: No thyromegaly.     Vascular: No JVD.     Trachea: No tracheal deviation.  Cardiovascular:     Rate and Rhythm: Normal rate and regular rhythm.     Heart sounds: No murmur heard. Pulmonary:     Effort: Pulmonary effort is normal. No respiratory distress.     Breath sounds: Normal breath sounds. No wheezing.  Abdominal:     General: Bowel sounds are normal. There is no distension.     Palpations: Abdomen is soft.     Tenderness: There is no abdominal tenderness.  Musculoskeletal:     Cervical back: Normal range of motion and neck supple.     Right lower leg: No edema.     Left lower leg: No edema.  Lymphadenopathy:     Cervical: No cervical adenopathy.  Skin:    General: Skin is warm and dry.     Findings: Rash (minimal residual erythema to left and right chest area) present.  Neurological:     General: No focal deficit present.     Mental Status: He is alert. Mental status is at baseline.  Psychiatric:        Mood and Affect: Mood normal.     Labs reviewed: Recent Labs    04/10/22 0000 10/23/22 0000 11/13/22 1319  NA 136* 137 135  K 4.9 4.6 4.4  CL 99 100 97*  CO2 27* 26* 31  GLUCOSE  --   --  121*  BUN 13 13 12   CREATININE 1.2 1.1 1.11  CALCIUM 10.0 9.6 9.9   Recent Labs    04/10/22 0000 10/23/22 0000 11/13/22 1319  AST 57* 46* 47*  ALT 40 32 31  ALKPHOS 118 112 88  BILITOT  --   --  1.6*  PROT  --   --  7.5  ALBUMIN 4.4 4.3 4.4   Recent Labs    10/23/22 0000 10/25/22 0000 11/13/22 1056  WBC 2.4 2.3 2.5*  NEUTROABS  --  1.20 1.5*  HGB 14.1 13.9 14.2  HCT 41  --  42.3  MCV  --   --  88.9  PLT 65* 68* 73*   Lab Results  Component Value Date   TSH 1.16 03/01/2022   No results found for: "HGBA1C" Lab Results  Component Value Date   CHOL 200  03/01/2022   HDL 85 (A) 03/01/2022   LDLCALC 96 03/01/2022   TRIG 95 03/01/2022    Significant Diagnostic Results in last 30 days:  No results found.  Assessment/Plan 1. Moderate late onset Alzheimer's dementia with agitation  We are still in the process of getting to know him and what routines and strategies will work best to care for him, particularly with bathing. I recommend the nurse try to give the ativan prior to his bath and see if this is successful. If behaviors are escalating could consider low dose seroquel.   2. Essential hypertension Controlled Continue metoprolol  3. Mixed hyperlipidemia Lab Results  Component Value Date   LDLCALC 96 03/01/2022   Not currently on medication   4. Benign prostatic hyperplasia without lower urinary tract symptoms Continue Flomax.   5. Thrombocytopenia (HCC) Lab Results  Component Value Date   PLT 73 (L) 11/13/2022  ?possible due to cirrhosis. No current symptoms. Given his age and dementia aggressive work up would not be warranted.   6. Neutropenia, unspecified type (HCC) Lab Results  Component Value Date   WBC 2.5 (L) 11/13/2022  As above.    7. Acute bacterial conjunctivitis of right eye Improved Continue erythromycin to complete 7 day course   8. Tinea cruris Improved.    Family/ staff Communication: nurse  Labs/tests ordered:  NA

## 2022-11-27 ENCOUNTER — Other Ambulatory Visit: Payer: Self-pay | Admitting: Orthopedic Surgery

## 2022-11-27 ENCOUNTER — Encounter: Payer: Self-pay | Admitting: Adult Health

## 2022-11-27 DIAGNOSIS — F02B0 Dementia in other diseases classified elsewhere, moderate, without behavioral disturbance, psychotic disturbance, mood disturbance, and anxiety: Secondary | ICD-10-CM

## 2022-11-27 DIAGNOSIS — D696 Thrombocytopenia, unspecified: Secondary | ICD-10-CM | POA: Insufficient documentation

## 2022-11-27 DIAGNOSIS — D709 Neutropenia, unspecified: Secondary | ICD-10-CM | POA: Insufficient documentation

## 2022-11-27 MED ORDER — LORAZEPAM 0.5 MG PO TABS
0.5000 mg | ORAL_TABLET | Freq: Two times a day (BID) | ORAL | 0 refills | Status: DC | PRN
Start: 2022-11-27 — End: 2022-12-13

## 2022-11-29 ENCOUNTER — Encounter: Payer: Self-pay | Admitting: Internal Medicine

## 2022-11-29 ENCOUNTER — Non-Acute Institutional Stay (SKILLED_NURSING_FACILITY): Payer: Medicare Other | Admitting: Internal Medicine

## 2022-11-29 DIAGNOSIS — D702 Other drug-induced agranulocytosis: Secondary | ICD-10-CM | POA: Diagnosis not present

## 2022-11-29 DIAGNOSIS — F02B0 Dementia in other diseases classified elsewhere, moderate, without behavioral disturbance, psychotic disturbance, mood disturbance, and anxiety: Secondary | ICD-10-CM

## 2022-11-29 DIAGNOSIS — D696 Thrombocytopenia, unspecified: Secondary | ICD-10-CM

## 2022-11-29 DIAGNOSIS — I10 Essential (primary) hypertension: Secondary | ICD-10-CM | POA: Diagnosis not present

## 2022-11-29 DIAGNOSIS — G301 Alzheimer's disease with late onset: Secondary | ICD-10-CM

## 2022-11-29 NOTE — Assessment & Plan Note (Signed)
He exhibits profound dementia confabulating nonsensically with responses totally unrelated to the query.  No behavioral issues reported.

## 2022-11-29 NOTE — Assessment & Plan Note (Signed)
No bleeding dyscrasias reported at the SNF.  Continue to monitor.

## 2022-11-29 NOTE — Assessment & Plan Note (Addendum)
Blood pressures serially have ranged from 132/66 up to the low 150s systolically. Blood pressure average will be verified and his beta-blocker titrated as clinically indicated.

## 2022-11-29 NOTE — Patient Instructions (Signed)
See assessment and plan under each diagnosis in the problem list and acutely for this visit 

## 2022-11-29 NOTE — Progress Notes (Signed)
   NURSING HOME LOCATION:  [facility]  CODE STATUS:  to be verified  PCP:  Einar Crow MD  This is a comprehensive admission note to the Memory Care Unit of this SNFperformed on this date less than 30 days from date of admission. Included are preadmission medical/surgical history; reconciled medication list; family history; social history and comprehensive review of systems.  Corrections and additions to the records were documented. Comprehensive physical exam was also performed. Additionally a clinical summary was entered for each active diagnosis pertinent to this admission in the Problem List to enhance continuity of care.  HPI: He is a new admit to the memory care unit because of progressive dementia.    Past medical and surgical history: Medical diagnoses include essential hypertension, dyslipidemia, dementia, and BPH.  He has had TURP performed.  Urology follows him for intermittent hematuria. Heme/Onc saw the patient 5/7 for thrombocytopenia and neutropenia.  In the context of history of heavy alcohol intake; cirrhosis as an etiology was considered.  His AST was 47 and total bilirubin 1.6.  Myeloma panel revealed an IgM of 186.  Social history: Intake of 2-3 glasses of wine daily reported.  Previously he apparently drank heavier than this and has been a drinker for over 4 decades.  He is a former smoker.  Family history: Noncontributory due to advanced age.   Review of systems: Severe neurocognitive deficits precluded ROS completion.  He confabulated nonsensically.  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  When I asked about the well-healed scars over the right forehead he could not tell me the etiology.  I asked if  it had been related to an accident and he said "probably but no one was hurt."  Physical exam:  Pertinent or positive findings: Facies are blank.  He has ptosis greater on the left.  As noted he has irregular ,well-healed scarring over the right forehead.  He has small vitiliginous spots over the left jaw.  There is slight increase in S2.  Abdomen is protuberant.  The posterior tibial pulses are stronger than the dorsalis pedis pulses.  General appearance: Adequately nourished; no acute distress, increased work of breathing is present.   Lymphatic: No lymphadenopathy about the head, neck, axilla. Eyes: No conjunctival inflammation or lid edema is present. There is no scleral icterus. Ears:  External ear exam shows no significant lesions or deformities.   Nose:  External nasal examination shows no deformity or inflammation. Nasal mucosa are pink and moist without lesions, exudates Oral exam: Lips and gums are healthy appearing.There is no oropharyngeal erythema or exudate. Neck:  No thyromegaly, masses, tenderness noted.    Heart:  No gallop, murmur, click, rub.  Lungs: Chest clear to auscultation without wheezes, rhonchi, rales, rubs. Abdomen: Bowel sounds are normal.  Abdomen is soft and nontender with no organomegaly, hernias, masses. GU: Deferred  Extremities:  No cyanosis, clubbing, edema. Neurologic exam:  Balance, Rhomberg, finger to nose testing could not be completed due to clinical state Skin: Warm & dry w/o tenting. No significant rash.  See clinical summary under each active problem in the Problem List with associated updated therapeutic plan

## 2022-11-30 DIAGNOSIS — F01A11 Vascular dementia, mild, with agitation: Secondary | ICD-10-CM | POA: Diagnosis not present

## 2022-11-30 DIAGNOSIS — F332 Major depressive disorder, recurrent severe without psychotic features: Secondary | ICD-10-CM | POA: Diagnosis not present

## 2022-12-11 DIAGNOSIS — R41841 Cognitive communication deficit: Secondary | ICD-10-CM | POA: Diagnosis not present

## 2022-12-11 DIAGNOSIS — M6389 Disorders of muscle in diseases classified elsewhere, multiple sites: Secondary | ICD-10-CM | POA: Diagnosis not present

## 2022-12-11 DIAGNOSIS — Z9181 History of falling: Secondary | ICD-10-CM | POA: Diagnosis not present

## 2022-12-11 DIAGNOSIS — R2689 Other abnormalities of gait and mobility: Secondary | ICD-10-CM | POA: Diagnosis not present

## 2022-12-11 DIAGNOSIS — R278 Other lack of coordination: Secondary | ICD-10-CM | POA: Diagnosis not present

## 2022-12-11 DIAGNOSIS — R4189 Other symptoms and signs involving cognitive functions and awareness: Secondary | ICD-10-CM | POA: Diagnosis not present

## 2022-12-11 DIAGNOSIS — F02B0 Dementia in other diseases classified elsewhere, moderate, without behavioral disturbance, psychotic disturbance, mood disturbance, and anxiety: Secondary | ICD-10-CM | POA: Diagnosis not present

## 2022-12-13 ENCOUNTER — Non-Acute Institutional Stay (SKILLED_NURSING_FACILITY): Payer: Medicare Other | Admitting: Adult Health

## 2022-12-13 ENCOUNTER — Encounter: Payer: Self-pay | Admitting: Adult Health

## 2022-12-13 DIAGNOSIS — R41841 Cognitive communication deficit: Secondary | ICD-10-CM | POA: Diagnosis not present

## 2022-12-13 DIAGNOSIS — R197 Diarrhea, unspecified: Secondary | ICD-10-CM

## 2022-12-13 DIAGNOSIS — R2689 Other abnormalities of gait and mobility: Secondary | ICD-10-CM | POA: Diagnosis not present

## 2022-12-13 DIAGNOSIS — Z9181 History of falling: Secondary | ICD-10-CM | POA: Diagnosis not present

## 2022-12-13 DIAGNOSIS — F02B0 Dementia in other diseases classified elsewhere, moderate, without behavioral disturbance, psychotic disturbance, mood disturbance, and anxiety: Secondary | ICD-10-CM | POA: Diagnosis not present

## 2022-12-13 DIAGNOSIS — R278 Other lack of coordination: Secondary | ICD-10-CM | POA: Diagnosis not present

## 2022-12-13 DIAGNOSIS — R4189 Other symptoms and signs involving cognitive functions and awareness: Secondary | ICD-10-CM | POA: Diagnosis not present

## 2022-12-13 NOTE — Progress Notes (Signed)
Location:  [facility] Room Number: Memory Care [address]A Place of Service:  SNF [phone]) Provider:  Peggye Ley, NP  XWR:UEAVW, Freddie Breech, MD  Patient Care Team: Mahlon Gammon, MD as PCP - General (Internal Medicine) Sinda Du, MD as Consulting Physician (Ophthalmology) Raeanne Barry, DMD (Dentistry)  Extended Emergency Contact Information Primary Emergency Contact: o'brien,mary Mobile Phone: [phone] Relation: Spouse Preferred language: English Secondary Emergency Contact: ROBINSON,JULIA Mobile Phone: [phone] Relation: Daughter Interpreter needed? No  Code Status:  DNR Goals of care: Advanced Directive information    12/13/2022   12:14 PM  Advanced Directives  Does Patient Have a Medical Advance Directive? Yes  Type of Estate agent of [place];Out of facility DNR (pink MOST or yellow form)  Does patient want to make changes to medical advance directive? No - Patient declined  Copy of Healthcare Power of Attorney in Chart? No - copy requested     Chief Complaint  Patient presents with   Acute Visit    Diarrhea    HPI:  Pt is a 88 y.o. male seen today for an acute visit for Diarrhea  He has has loose stools the past few days. No fever or abd pain. No vomiting. No oral antibiotic. Did take diflucan only for a rash which improved Wife reports about once a week he would have a loose stool prior to moving to skilled care He was started on Rexulti 5/24, dose increased 6/1. Also on haldol and temazepam which are new.  Behaviors of angry outburst and resistance to care are slightly improving.    Past Medical History:  Diagnosis Date   BPH (benign prostatic hyperplasia)    Chronic kidney disease    Dementia (HCC)    GERD (gastroesophageal reflux disease)    Hyperlipidemia    Hypertension    Past Surgical History:  Procedure Laterality Date   TRANSURETHRAL RESECTION OF PROSTATE      No Known  Allergies  Outpatient Encounter Medications as of 12/13/2022  Medication Sig   B Complex Vitamins (VITAMIN B COMPLEX) TABS Take 1 tablet by mouth daily.   brexpiprazole (REXULTI) 2 MG TABS tablet Take by mouth at bedtime.   haloperidol (HALDOL) 2 MG tablet Take 2 mg by mouth daily.   haloperidol lactate (HALDOL) 5 MG/ML injection Inject 2 mg into the muscle every 8 (eight) hours as needed.   memantine (NAMENDA) 10 MG tablet Take 10 mg by mouth 2 (two) times daily.   Metoprolol Succinate 25 MG CS24 Take one tablet by mouth once daily.   Multiple Vitamin (MULTIVITAMIN) tablet Take 1 tablet by mouth daily.   pantoprazole (PROTONIX) 40 MG tablet Take 1 tablet (40 mg total) by mouth daily.   rivastigmine (EXELON) 13.3 MG/24HR Place 1 patch (13.3 mg total) onto the skin daily.   tamsulosin (FLOMAX) 0.4 MG CAPS capsule Take 1 capsule (0.4 mg total) by mouth daily.   temazepam (RESTORIL) 15 MG capsule Take 15 mg by mouth at bedtime. If resident still not sleeping after 2 days, increase to Restoril 30mg  at bedtime.   [DISCONTINUED] erythromycin ophthalmic ointment Place 1 Application into both eyes 3 (three) times daily for 7 days.   [DISCONTINUED] LORazepam (ATIVAN) 0.5 MG tablet Take 1 tablet (0.5 mg total) by mouth 2 (two) times daily as needed for anxiety.   [DISCONTINUED] pneumococcal 20-valent conjugate vaccine (PREVNAR 20) 0.5 ML injection Inject into the muscle.   [DISCONTINUED] Tdap (BOOSTRIX) 5-2.5-18.5 LF-MCG/0.5 injection Inject 0.5 mLs into the  muscle.   No facility-administered encounter medications on file as of 12/13/2022.    Review of Systems  Unable to perform ROS: Dementia    Immunization History  Administered Date(s) Administered   Fluad Quad(high Dose 65+) 04/10/2022   Influenza-Unspecified 05/06/2020   Moderna Covid-19 Vaccine Bivalent Booster 101yrs & up 11/02/2022   Moderna SARS-COV2 Booster Vaccination 11/04/2020, 12/11/2021   Moderna Sars-Covid-2 Vaccination 07/21/2019,  08/18/2019, 05/25/2020, 04/21/2021   PNEUMOCOCCAL CONJUGATE-20 05/02/2022   Pneumococcal-Unspecified 06/22/2014   Td 07/09/2010   Tdap 11/14/2022   Zoster Recombinat (Shingrix) 07/09/2010, 05/09/2018   Pertinent  Health Maintenance Due  Topic Date Due   INFLUENZA VACCINE  02/07/2023      04/24/2022    9:21 AM 10/29/2022    1:18 PM 11/19/2022    3:34 PM  Fall Risk  Falls in the past year? 0 0 0  Was there an injury with Fall? 0 0 0  Fall Risk Category Calculator 0 0 0  Fall Risk Category (Retired) Low    (RETIRED) Patient Fall Risk Level Low fall risk    Patient at Risk for Falls Due to No Fall Risks No Fall Risks No Fall Risks  Fall risk Follow up Falls evaluation completed  Falls evaluation completed   Functional Status Survey:    Vitals:   12/13/22 1150 12/13/22 1217  BP: (!) 145/62 134/74  Pulse: 62   Resp: 20   Temp: (!) 97.2 F (36.2 C)   SpO2: 94%   Weight: 195 lb 12.8 oz (88.8 kg)   Height: 5\' 9"  (1.753 m)    Body mass index is 28.91 kg/m. Physical Exam Vitals and nursing note reviewed.  Constitutional:      Appearance: Normal appearance.  Abdominal:     General: Bowel sounds are normal. There is no distension.     Palpations: Abdomen is soft.  Neurological:     General: No focal deficit present.     Mental Status: He is alert. Mental status is at baseline.  Psychiatric:        Mood and Affect: Mood normal.     Labs reviewed: Recent Labs    04/10/22 0000 10/23/22 0000 11/13/22 1319  NA 136* 137 135  K 4.9 4.6 4.4  CL 99 100 97*  CO2 27* 26* 31  GLUCOSE  --   --  121*  BUN 13 13 12   CREATININE 1.2 1.1 1.11  CALCIUM 10.0 9.6 9.9   Recent Labs    04/10/22 0000 10/23/22 0000 11/13/22 1319  AST 57* 46* 47*  ALT 40 32 31  ALKPHOS 118 112 88  BILITOT  --   --  1.6*  PROT  --   --  7.5  ALBUMIN 4.4 4.3 4.4   Recent Labs    10/23/22 0000 10/25/22 0000 11/13/22 1056  WBC 2.4 2.3 2.5*  NEUTROABS  --  1.20 1.5*  HGB 14.1 13.9 14.2   HCT 41  --  42.3  MCV  --   --  88.9  PLT 65* 68* 73*   Lab Results  Component Value Date   TSH 1.16 03/01/2022   No results found for: "HGBA1C" Lab Results  Component Value Date   CHOL 200 03/01/2022   HDL 85 (A) 03/01/2022   LDLCALC 96 03/01/2022   TRIG 95 03/01/2022    Significant Diagnostic Results in last 30 days:  No results found.  Assessment/Plan  1. Diarrhea, unspecified type Does not appear acutely ill Staff modifying diet to avoid  dairy and lactose and more bland items Will reduce Rexulti to 1 mg  and see if this helps Can re challenge him later if symptoms don't improve.  Would check stool studies if not improving Add imodium 2mg  qd prn for 3 days Report if not improving   Family/ staff Communication: wife  Labs/tests ordered:  Stool study if not improving.

## 2022-12-14 DIAGNOSIS — R278 Other lack of coordination: Secondary | ICD-10-CM | POA: Diagnosis not present

## 2022-12-14 DIAGNOSIS — F02B0 Dementia in other diseases classified elsewhere, moderate, without behavioral disturbance, psychotic disturbance, mood disturbance, and anxiety: Secondary | ICD-10-CM | POA: Diagnosis not present

## 2022-12-14 DIAGNOSIS — R2689 Other abnormalities of gait and mobility: Secondary | ICD-10-CM | POA: Diagnosis not present

## 2022-12-14 DIAGNOSIS — Z9181 History of falling: Secondary | ICD-10-CM | POA: Diagnosis not present

## 2022-12-14 DIAGNOSIS — R4189 Other symptoms and signs involving cognitive functions and awareness: Secondary | ICD-10-CM | POA: Diagnosis not present

## 2022-12-14 DIAGNOSIS — R41841 Cognitive communication deficit: Secondary | ICD-10-CM | POA: Diagnosis not present

## 2022-12-16 DIAGNOSIS — R4189 Other symptoms and signs involving cognitive functions and awareness: Secondary | ICD-10-CM | POA: Diagnosis not present

## 2022-12-16 DIAGNOSIS — R41841 Cognitive communication deficit: Secondary | ICD-10-CM | POA: Diagnosis not present

## 2022-12-16 DIAGNOSIS — F02B0 Dementia in other diseases classified elsewhere, moderate, without behavioral disturbance, psychotic disturbance, mood disturbance, and anxiety: Secondary | ICD-10-CM | POA: Diagnosis not present

## 2022-12-16 DIAGNOSIS — R278 Other lack of coordination: Secondary | ICD-10-CM | POA: Diagnosis not present

## 2022-12-16 DIAGNOSIS — R2689 Other abnormalities of gait and mobility: Secondary | ICD-10-CM | POA: Diagnosis not present

## 2022-12-16 DIAGNOSIS — Z9181 History of falling: Secondary | ICD-10-CM | POA: Diagnosis not present

## 2022-12-17 DIAGNOSIS — F02B0 Dementia in other diseases classified elsewhere, moderate, without behavioral disturbance, psychotic disturbance, mood disturbance, and anxiety: Secondary | ICD-10-CM | POA: Diagnosis not present

## 2022-12-17 DIAGNOSIS — R278 Other lack of coordination: Secondary | ICD-10-CM | POA: Diagnosis not present

## 2022-12-17 DIAGNOSIS — Z9181 History of falling: Secondary | ICD-10-CM | POA: Diagnosis not present

## 2022-12-17 DIAGNOSIS — R41841 Cognitive communication deficit: Secondary | ICD-10-CM | POA: Diagnosis not present

## 2022-12-17 DIAGNOSIS — R4189 Other symptoms and signs involving cognitive functions and awareness: Secondary | ICD-10-CM | POA: Diagnosis not present

## 2022-12-17 DIAGNOSIS — R2689 Other abnormalities of gait and mobility: Secondary | ICD-10-CM | POA: Diagnosis not present

## 2022-12-18 DIAGNOSIS — R2689 Other abnormalities of gait and mobility: Secondary | ICD-10-CM | POA: Diagnosis not present

## 2022-12-18 DIAGNOSIS — Z9181 History of falling: Secondary | ICD-10-CM | POA: Diagnosis not present

## 2022-12-18 DIAGNOSIS — F02B0 Dementia in other diseases classified elsewhere, moderate, without behavioral disturbance, psychotic disturbance, mood disturbance, and anxiety: Secondary | ICD-10-CM | POA: Diagnosis not present

## 2022-12-18 DIAGNOSIS — R4189 Other symptoms and signs involving cognitive functions and awareness: Secondary | ICD-10-CM | POA: Diagnosis not present

## 2022-12-18 DIAGNOSIS — R41841 Cognitive communication deficit: Secondary | ICD-10-CM | POA: Diagnosis not present

## 2022-12-18 DIAGNOSIS — R278 Other lack of coordination: Secondary | ICD-10-CM | POA: Diagnosis not present

## 2022-12-19 DIAGNOSIS — F02B0 Dementia in other diseases classified elsewhere, moderate, without behavioral disturbance, psychotic disturbance, mood disturbance, and anxiety: Secondary | ICD-10-CM | POA: Diagnosis not present

## 2022-12-19 DIAGNOSIS — Z9181 History of falling: Secondary | ICD-10-CM | POA: Diagnosis not present

## 2022-12-19 DIAGNOSIS — R4189 Other symptoms and signs involving cognitive functions and awareness: Secondary | ICD-10-CM | POA: Diagnosis not present

## 2022-12-19 DIAGNOSIS — R2689 Other abnormalities of gait and mobility: Secondary | ICD-10-CM | POA: Diagnosis not present

## 2022-12-19 DIAGNOSIS — R278 Other lack of coordination: Secondary | ICD-10-CM | POA: Diagnosis not present

## 2022-12-19 DIAGNOSIS — R41841 Cognitive communication deficit: Secondary | ICD-10-CM | POA: Diagnosis not present

## 2022-12-20 DIAGNOSIS — R41841 Cognitive communication deficit: Secondary | ICD-10-CM | POA: Diagnosis not present

## 2022-12-20 DIAGNOSIS — R278 Other lack of coordination: Secondary | ICD-10-CM | POA: Diagnosis not present

## 2022-12-20 DIAGNOSIS — Z9181 History of falling: Secondary | ICD-10-CM | POA: Diagnosis not present

## 2022-12-20 DIAGNOSIS — R2689 Other abnormalities of gait and mobility: Secondary | ICD-10-CM | POA: Diagnosis not present

## 2022-12-20 DIAGNOSIS — R4189 Other symptoms and signs involving cognitive functions and awareness: Secondary | ICD-10-CM | POA: Diagnosis not present

## 2022-12-20 DIAGNOSIS — F02B0 Dementia in other diseases classified elsewhere, moderate, without behavioral disturbance, psychotic disturbance, mood disturbance, and anxiety: Secondary | ICD-10-CM | POA: Diagnosis not present

## 2022-12-24 DIAGNOSIS — R41841 Cognitive communication deficit: Secondary | ICD-10-CM | POA: Diagnosis not present

## 2022-12-24 DIAGNOSIS — R4189 Other symptoms and signs involving cognitive functions and awareness: Secondary | ICD-10-CM | POA: Diagnosis not present

## 2022-12-24 DIAGNOSIS — R2689 Other abnormalities of gait and mobility: Secondary | ICD-10-CM | POA: Diagnosis not present

## 2022-12-24 DIAGNOSIS — Z9181 History of falling: Secondary | ICD-10-CM | POA: Diagnosis not present

## 2022-12-24 DIAGNOSIS — F02B0 Dementia in other diseases classified elsewhere, moderate, without behavioral disturbance, psychotic disturbance, mood disturbance, and anxiety: Secondary | ICD-10-CM | POA: Diagnosis not present

## 2022-12-24 DIAGNOSIS — R278 Other lack of coordination: Secondary | ICD-10-CM | POA: Diagnosis not present

## 2022-12-26 DIAGNOSIS — R4189 Other symptoms and signs involving cognitive functions and awareness: Secondary | ICD-10-CM | POA: Diagnosis not present

## 2022-12-26 DIAGNOSIS — Z9181 History of falling: Secondary | ICD-10-CM | POA: Diagnosis not present

## 2022-12-26 DIAGNOSIS — R41841 Cognitive communication deficit: Secondary | ICD-10-CM | POA: Diagnosis not present

## 2022-12-26 DIAGNOSIS — F02B0 Dementia in other diseases classified elsewhere, moderate, without behavioral disturbance, psychotic disturbance, mood disturbance, and anxiety: Secondary | ICD-10-CM | POA: Diagnosis not present

## 2022-12-26 DIAGNOSIS — R278 Other lack of coordination: Secondary | ICD-10-CM | POA: Diagnosis not present

## 2022-12-26 DIAGNOSIS — R2689 Other abnormalities of gait and mobility: Secondary | ICD-10-CM | POA: Diagnosis not present

## 2022-12-27 DIAGNOSIS — F02B0 Dementia in other diseases classified elsewhere, moderate, without behavioral disturbance, psychotic disturbance, mood disturbance, and anxiety: Secondary | ICD-10-CM | POA: Diagnosis not present

## 2022-12-27 DIAGNOSIS — R4189 Other symptoms and signs involving cognitive functions and awareness: Secondary | ICD-10-CM | POA: Diagnosis not present

## 2022-12-27 DIAGNOSIS — R2689 Other abnormalities of gait and mobility: Secondary | ICD-10-CM | POA: Diagnosis not present

## 2022-12-27 DIAGNOSIS — R41841 Cognitive communication deficit: Secondary | ICD-10-CM | POA: Diagnosis not present

## 2022-12-27 DIAGNOSIS — R278 Other lack of coordination: Secondary | ICD-10-CM | POA: Diagnosis not present

## 2022-12-27 DIAGNOSIS — Z9181 History of falling: Secondary | ICD-10-CM | POA: Diagnosis not present

## 2022-12-28 ENCOUNTER — Encounter: Payer: Self-pay | Admitting: Adult Health

## 2022-12-28 ENCOUNTER — Non-Acute Institutional Stay (SKILLED_NURSING_FACILITY): Payer: Medicare Other | Admitting: Adult Health

## 2022-12-28 DIAGNOSIS — D696 Thrombocytopenia, unspecified: Secondary | ICD-10-CM | POA: Diagnosis not present

## 2022-12-28 DIAGNOSIS — R21 Rash and other nonspecific skin eruption: Secondary | ICD-10-CM | POA: Diagnosis not present

## 2022-12-28 DIAGNOSIS — N400 Enlarged prostate without lower urinary tract symptoms: Secondary | ICD-10-CM | POA: Diagnosis not present

## 2022-12-28 DIAGNOSIS — G301 Alzheimer's disease with late onset: Secondary | ICD-10-CM | POA: Diagnosis not present

## 2022-12-28 DIAGNOSIS — I10 Essential (primary) hypertension: Secondary | ICD-10-CM | POA: Diagnosis not present

## 2022-12-28 DIAGNOSIS — F02B0 Dementia in other diseases classified elsewhere, moderate, without behavioral disturbance, psychotic disturbance, mood disturbance, and anxiety: Secondary | ICD-10-CM

## 2022-12-28 NOTE — Progress Notes (Unsigned)
Location:   [facility] Room Number: [address]A Place of Service:  SNF [phone]) Provider:  Fletcher Anon, NP  Mahlon Gammon, MD  Patient Care Team: Mahlon Gammon, MD as PCP - General (Internal Medicine) Sinda Du, MD as Consulting Physician (Ophthalmology) Raeanne Barry, DMD (Dentistry)  Extended Emergency Contact Information Primary Emergency Contact: o'brien,mary Mobile Phone: [phone] Relation: Spouse Preferred language: [place] Secondary Emergency Contact: ROBINSON,JULIA Mobile Phone: [phone] Relation: Daughter Interpreter needed? No  Code Status:  DNR Goals of care: Advanced Directive information    12/28/2022   10:28 AM  Advanced Directives  Does Patient Have a Medical Advance Directive? Yes  Type of Advance Directive Out of facility DNR (pink MOST or yellow form)  Does patient want to make changes to medical advance directive? No - Patient declined  Pre-existing out of facility DNR order (yellow form or pink MOST form) Pink MOST form placed in chart (order not valid for inpatient use)     Chief Complaint  Patient presents with   Medical Management of Chronic Issues    Routine visit   Quality Metric Gaps    Medicare annual wellness due   Immunizations    COVID booster due    HPI:  Pt is a 88 y.o. male seen today for medical management of chronic diseases.   Dementia diagnosed as MCI in 2008 at [facility] progressed over time now in memory care at [facility]. Currently on namenda and exelon. He was seen by Dr Donell Beers for aggression, exit seeking behaviors. Now on Rexulti and Haldol. Also on temazepam for sleep. Nurse reports his behaviors are much improved but his gait is slowing and less steady. He is still not sleeping through the night. The Rexulti dose was decreased from 2 to 1 mg due to diarrhea and symptoms resolved.  He has a hx low white count and low platelets. He was referred to hematology and seen by Dr Truett Perna  on 11/13/22.  He was not felt to be a candidate for bone marrow bx. The differential includes myelodysplasia and cirrhosis due to heavy alcohol use.  He has no current symptoms  HTN on metoprolol 109/69  BPH: has incontinence and wears brief. No issues with retention or UTI for this visit. Hx of TURP. Past Medical History:  Diagnosis Date   BPH (benign prostatic hyperplasia)    Chronic kidney disease    Dementia (HCC)    GERD (gastroesophageal reflux disease)    Hyperlipidemia    Hypertension    Past Surgical History:  Procedure Laterality Date   TRANSURETHRAL RESECTION OF PROSTATE      No Known Allergies  Allergies as of 12/28/2022   No Known Allergies      Medication List        Accurate as of December 28, 2022 10:37 AM. If you have any questions, ask your nurse or doctor.          brexpiprazole 1 MG Tabs tablet Commonly known as: REXULTI Take 1 mg by mouth at bedtime.   haloperidol 2 MG tablet Commonly known as: HALDOL Take 2 mg by mouth daily.   haloperidol lactate 5 MG/ML injection Commonly known as: HALDOL Inject 2 mg into the muscle every 8 (eight) hours as needed.   memantine 10 MG tablet Commonly known as: NAMENDA Take 10 mg by mouth 2 (two) times daily.   Metoprolol Succinate 25 MG Cs24 Take one tablet by mouth once daily.   multivitamin tablet Take 1 tablet by  mouth daily.   pantoprazole 40 MG tablet Commonly known as: PROTONIX Take 1 tablet (40 mg total) by mouth daily.   rivastigmine 13.3 MG/24HR Commonly known as: EXELON Place 1 patch (13.3 mg total) onto the skin daily.   tamsulosin 0.4 MG Caps capsule Commonly known as: FLOMAX Take 1 capsule (0.4 mg total) by mouth daily.   temazepam 30 MG capsule Commonly known as: RESTORIL Take 30 mg by mouth at bedtime. If resident still not sleeping after 2 days, increase to Restoril 30mg  at bedtime.   Vitamin B Complex Tabs Take 1 tablet by mouth daily.        Review of Systems  Unable to  perform ROS: Dementia    Immunization History  Administered Date(s) Administered   Fluad Quad(high Dose 65+) 04/10/2022   Influenza-Unspecified 05/06/2020   Moderna Covid-19 Vaccine Bivalent Booster 3yrs & up 11/02/2022   Moderna SARS-COV2 Booster Vaccination 11/04/2020, 12/11/2021   Moderna Sars-Covid-2 Vaccination 07/21/2019, 08/18/2019, 05/25/2020, 04/21/2021   PNEUMOCOCCAL CONJUGATE-20 05/02/2022   Pneumococcal-Unspecified 06/22/2014   Td 07/09/2010   Tdap 11/14/2022   Zoster Recombinat (Shingrix) 07/09/2010, 05/09/2018   Pertinent  Health Maintenance Due  Topic Date Due   INFLUENZA VACCINE  02/07/2023      04/24/2022    9:21 AM 10/29/2022    1:18 PM 11/19/2022    3:34 PM  Fall Risk  Falls in the past year? 0 0 0  Was there an injury with Fall? 0 0 0  Fall Risk Category Calculator 0 0 0  Fall Risk Category (Retired) Low    (RETIRED) Patient Fall Risk Level Low fall risk    Patient at Risk for Falls Due to No Fall Risks No Fall Risks No Fall Risks  Fall risk Follow up Falls evaluation completed  Falls evaluation completed   Functional Status Survey:    Vitals:   12/28/22 1020  BP: 109/69  Pulse: (!) 57  Resp: 18  Temp: (!) 96.8 F (36 C)  SpO2: 98%  Weight: 195 lb 12.8 oz (88.8 kg)  Height: 5\' 9"  (1.753 m)   Body mass index is 28.91 kg/m.  Wt Readings from Last 3 Encounters:  12/28/22 195 lb 12.8 oz (88.8 kg)  12/13/22 195 lb 12.8 oz (88.8 kg)  11/26/22 191 lb 3.2 oz (86.7 kg)    Physical Exam Constitutional:      General: He is not in acute distress.    Appearance: He is not diaphoretic.  HENT:     Head: Normocephalic and atraumatic.  Neck:     Thyroid: No thyromegaly.     Vascular: No JVD.     Trachea: No tracheal deviation.  Cardiovascular:     Rate and Rhythm: Normal rate and regular rhythm.     Heart sounds: No murmur heard. Pulmonary:     Effort: Pulmonary effort is normal. No respiratory distress.     Breath sounds: Normal breath sounds.  No wheezing.  Abdominal:     General: Bowel sounds are normal. There is no distension.     Palpations: Abdomen is soft.     Tenderness: There is no abdominal tenderness.  Musculoskeletal:     Right lower leg: No edema.     Left lower leg: No edema.  Lymphadenopathy:     Cervical: No cervical adenopathy.  Skin:    General: Skin is warm and dry.     Findings: Rash (maculopapular rash to chest) present.  Neurological:     General: No focal deficit present.  Mental Status: He is alert. Mental status is at baseline.     Labs reviewed: Recent Labs    04/10/22 0000 10/23/22 0000 11/13/22 1319  NA 136* 137 135  K 4.9 4.6 4.4  CL 99 100 97*  CO2 27* 26* 31  GLUCOSE  --   --  121*  BUN 13 13 12   CREATININE 1.2 1.1 1.11  CALCIUM 10.0 9.6 9.9   Recent Labs    04/10/22 0000 10/23/22 0000 11/13/22 1319  AST 57* 46* 47*  ALT 40 32 31  ALKPHOS 118 112 88  BILITOT  --   --  1.6*  PROT  --   --  7.5  ALBUMIN 4.4 4.3 4.4   Recent Labs    10/23/22 0000 10/25/22 0000 11/13/22 1056  WBC 2.4 2.3 2.5*  NEUTROABS  --  1.20 1.5*  HGB 14.1 13.9 14.2  HCT 41  --  42.3  MCV  --   --  88.9  PLT 65* 68* 73*   Lab Results  Component Value Date   TSH 1.16 03/01/2022   No results found for: "HGBA1C" Lab Results  Component Value Date   CHOL 200 03/01/2022   HDL 85 (A) 03/01/2022   LDLCALC 96 03/01/2022   TRIG 95 03/01/2022    Significant Diagnostic Results in last 30 days:  No results found.  Assessment/Plan  1. Rash  - clotrimazole-betamethasone (LOTRISONE) cream; Apply 1 Application topically 2 (two) times daily for 10 days. To chest and other affected areas.  Dispense: 30 g; Refill: 0  2. Moderate late onset Alzheimer's dementia without behavioral disturbance, psychotic disturbance, mood disturbance, or anxiety (HCC) Behaviors improving. Adjusting to memory care Continue current meds He will f/u with Dr Donell Beers.  3. Essential hypertension Controlled Continue  metoprolol   4. Benign prostatic hyperplasia without lower urinary tract symptoms Continue flomax.   5. Thrombocytopenia (HCC) Followed by heme/onc with labs and apt in August Also has leukopenia. Concern per note that this is myelodysplasia vs related to alcohol cirrhosis. Due to his progression in dementia and behaviors would lean toward less aggressive measure/work up.   Labs/tests ordered:  NA

## 2022-12-29 ENCOUNTER — Encounter: Payer: Self-pay | Admitting: Adult Health

## 2022-12-29 MED ORDER — CLOTRIMAZOLE-BETAMETHASONE 1-0.05 % EX CREA
1.0000 | TOPICAL_CREAM | Freq: Two times a day (BID) | CUTANEOUS | 0 refills | Status: AC
Start: 2022-12-29 — End: 2023-01-08

## 2022-12-31 DIAGNOSIS — Z9181 History of falling: Secondary | ICD-10-CM | POA: Diagnosis not present

## 2022-12-31 DIAGNOSIS — F02B0 Dementia in other diseases classified elsewhere, moderate, without behavioral disturbance, psychotic disturbance, mood disturbance, and anxiety: Secondary | ICD-10-CM | POA: Diagnosis not present

## 2022-12-31 DIAGNOSIS — R41841 Cognitive communication deficit: Secondary | ICD-10-CM | POA: Diagnosis not present

## 2022-12-31 DIAGNOSIS — R2689 Other abnormalities of gait and mobility: Secondary | ICD-10-CM | POA: Diagnosis not present

## 2022-12-31 DIAGNOSIS — R4189 Other symptoms and signs involving cognitive functions and awareness: Secondary | ICD-10-CM | POA: Diagnosis not present

## 2022-12-31 DIAGNOSIS — R278 Other lack of coordination: Secondary | ICD-10-CM | POA: Diagnosis not present

## 2023-01-01 DIAGNOSIS — F02B0 Dementia in other diseases classified elsewhere, moderate, without behavioral disturbance, psychotic disturbance, mood disturbance, and anxiety: Secondary | ICD-10-CM | POA: Diagnosis not present

## 2023-01-01 DIAGNOSIS — R41841 Cognitive communication deficit: Secondary | ICD-10-CM | POA: Diagnosis not present

## 2023-01-01 DIAGNOSIS — R2689 Other abnormalities of gait and mobility: Secondary | ICD-10-CM | POA: Diagnosis not present

## 2023-01-01 DIAGNOSIS — Z9181 History of falling: Secondary | ICD-10-CM | POA: Diagnosis not present

## 2023-01-01 DIAGNOSIS — R278 Other lack of coordination: Secondary | ICD-10-CM | POA: Diagnosis not present

## 2023-01-01 DIAGNOSIS — R4189 Other symptoms and signs involving cognitive functions and awareness: Secondary | ICD-10-CM | POA: Diagnosis not present

## 2023-01-03 DIAGNOSIS — R4189 Other symptoms and signs involving cognitive functions and awareness: Secondary | ICD-10-CM | POA: Diagnosis not present

## 2023-01-03 DIAGNOSIS — R41841 Cognitive communication deficit: Secondary | ICD-10-CM | POA: Diagnosis not present

## 2023-01-03 DIAGNOSIS — R2689 Other abnormalities of gait and mobility: Secondary | ICD-10-CM | POA: Diagnosis not present

## 2023-01-03 DIAGNOSIS — Z9181 History of falling: Secondary | ICD-10-CM | POA: Diagnosis not present

## 2023-01-03 DIAGNOSIS — R278 Other lack of coordination: Secondary | ICD-10-CM | POA: Diagnosis not present

## 2023-01-03 DIAGNOSIS — F02B0 Dementia in other diseases classified elsewhere, moderate, without behavioral disturbance, psychotic disturbance, mood disturbance, and anxiety: Secondary | ICD-10-CM | POA: Diagnosis not present

## 2023-01-07 DIAGNOSIS — R4189 Other symptoms and signs involving cognitive functions and awareness: Secondary | ICD-10-CM | POA: Diagnosis not present

## 2023-01-07 DIAGNOSIS — R41841 Cognitive communication deficit: Secondary | ICD-10-CM | POA: Diagnosis not present

## 2023-01-07 DIAGNOSIS — Z9181 History of falling: Secondary | ICD-10-CM | POA: Diagnosis not present

## 2023-01-07 DIAGNOSIS — F02B0 Dementia in other diseases classified elsewhere, moderate, without behavioral disturbance, psychotic disturbance, mood disturbance, and anxiety: Secondary | ICD-10-CM | POA: Diagnosis not present

## 2023-01-07 DIAGNOSIS — R278 Other lack of coordination: Secondary | ICD-10-CM | POA: Diagnosis not present

## 2023-01-07 DIAGNOSIS — M6389 Disorders of muscle in diseases classified elsewhere, multiple sites: Secondary | ICD-10-CM | POA: Diagnosis not present

## 2023-01-08 DIAGNOSIS — R4189 Other symptoms and signs involving cognitive functions and awareness: Secondary | ICD-10-CM | POA: Diagnosis not present

## 2023-01-08 DIAGNOSIS — R278 Other lack of coordination: Secondary | ICD-10-CM | POA: Diagnosis not present

## 2023-01-08 DIAGNOSIS — R41841 Cognitive communication deficit: Secondary | ICD-10-CM | POA: Diagnosis not present

## 2023-01-08 DIAGNOSIS — F02B0 Dementia in other diseases classified elsewhere, moderate, without behavioral disturbance, psychotic disturbance, mood disturbance, and anxiety: Secondary | ICD-10-CM | POA: Diagnosis not present

## 2023-01-08 DIAGNOSIS — Z9181 History of falling: Secondary | ICD-10-CM | POA: Diagnosis not present

## 2023-01-08 DIAGNOSIS — M6389 Disorders of muscle in diseases classified elsewhere, multiple sites: Secondary | ICD-10-CM | POA: Diagnosis not present

## 2023-01-10 DIAGNOSIS — R4189 Other symptoms and signs involving cognitive functions and awareness: Secondary | ICD-10-CM | POA: Diagnosis not present

## 2023-01-10 DIAGNOSIS — R278 Other lack of coordination: Secondary | ICD-10-CM | POA: Diagnosis not present

## 2023-01-10 DIAGNOSIS — R41841 Cognitive communication deficit: Secondary | ICD-10-CM | POA: Diagnosis not present

## 2023-01-10 DIAGNOSIS — M6389 Disorders of muscle in diseases classified elsewhere, multiple sites: Secondary | ICD-10-CM | POA: Diagnosis not present

## 2023-01-10 DIAGNOSIS — F02B0 Dementia in other diseases classified elsewhere, moderate, without behavioral disturbance, psychotic disturbance, mood disturbance, and anxiety: Secondary | ICD-10-CM | POA: Diagnosis not present

## 2023-01-10 DIAGNOSIS — Z9181 History of falling: Secondary | ICD-10-CM | POA: Diagnosis not present

## 2023-01-11 DIAGNOSIS — F332 Major depressive disorder, recurrent severe without psychotic features: Secondary | ICD-10-CM | POA: Diagnosis not present

## 2023-01-14 DIAGNOSIS — M6389 Disorders of muscle in diseases classified elsewhere, multiple sites: Secondary | ICD-10-CM | POA: Diagnosis not present

## 2023-01-14 DIAGNOSIS — F02B0 Dementia in other diseases classified elsewhere, moderate, without behavioral disturbance, psychotic disturbance, mood disturbance, and anxiety: Secondary | ICD-10-CM | POA: Diagnosis not present

## 2023-01-14 DIAGNOSIS — R4189 Other symptoms and signs involving cognitive functions and awareness: Secondary | ICD-10-CM | POA: Diagnosis not present

## 2023-01-14 DIAGNOSIS — Z9181 History of falling: Secondary | ICD-10-CM | POA: Diagnosis not present

## 2023-01-14 DIAGNOSIS — R41841 Cognitive communication deficit: Secondary | ICD-10-CM | POA: Diagnosis not present

## 2023-01-14 DIAGNOSIS — R278 Other lack of coordination: Secondary | ICD-10-CM | POA: Diagnosis not present

## 2023-01-15 DIAGNOSIS — R41841 Cognitive communication deficit: Secondary | ICD-10-CM | POA: Diagnosis not present

## 2023-01-15 DIAGNOSIS — F02B0 Dementia in other diseases classified elsewhere, moderate, without behavioral disturbance, psychotic disturbance, mood disturbance, and anxiety: Secondary | ICD-10-CM | POA: Diagnosis not present

## 2023-01-15 DIAGNOSIS — R4189 Other symptoms and signs involving cognitive functions and awareness: Secondary | ICD-10-CM | POA: Diagnosis not present

## 2023-01-15 DIAGNOSIS — Z9181 History of falling: Secondary | ICD-10-CM | POA: Diagnosis not present

## 2023-01-15 DIAGNOSIS — M6389 Disorders of muscle in diseases classified elsewhere, multiple sites: Secondary | ICD-10-CM | POA: Diagnosis not present

## 2023-01-15 DIAGNOSIS — R278 Other lack of coordination: Secondary | ICD-10-CM | POA: Diagnosis not present

## 2023-01-17 DIAGNOSIS — R278 Other lack of coordination: Secondary | ICD-10-CM | POA: Diagnosis not present

## 2023-01-17 DIAGNOSIS — R4189 Other symptoms and signs involving cognitive functions and awareness: Secondary | ICD-10-CM | POA: Diagnosis not present

## 2023-01-17 DIAGNOSIS — Z9181 History of falling: Secondary | ICD-10-CM | POA: Diagnosis not present

## 2023-01-17 DIAGNOSIS — F02B0 Dementia in other diseases classified elsewhere, moderate, without behavioral disturbance, psychotic disturbance, mood disturbance, and anxiety: Secondary | ICD-10-CM | POA: Diagnosis not present

## 2023-01-17 DIAGNOSIS — M6389 Disorders of muscle in diseases classified elsewhere, multiple sites: Secondary | ICD-10-CM | POA: Diagnosis not present

## 2023-01-17 DIAGNOSIS — R41841 Cognitive communication deficit: Secondary | ICD-10-CM | POA: Diagnosis not present

## 2023-01-18 DIAGNOSIS — F02B0 Dementia in other diseases classified elsewhere, moderate, without behavioral disturbance, psychotic disturbance, mood disturbance, and anxiety: Secondary | ICD-10-CM | POA: Diagnosis not present

## 2023-01-18 DIAGNOSIS — M6389 Disorders of muscle in diseases classified elsewhere, multiple sites: Secondary | ICD-10-CM | POA: Diagnosis not present

## 2023-01-18 DIAGNOSIS — R4189 Other symptoms and signs involving cognitive functions and awareness: Secondary | ICD-10-CM | POA: Diagnosis not present

## 2023-01-18 DIAGNOSIS — R41841 Cognitive communication deficit: Secondary | ICD-10-CM | POA: Diagnosis not present

## 2023-01-18 DIAGNOSIS — Z9181 History of falling: Secondary | ICD-10-CM | POA: Diagnosis not present

## 2023-01-18 DIAGNOSIS — R278 Other lack of coordination: Secondary | ICD-10-CM | POA: Diagnosis not present

## 2023-01-21 ENCOUNTER — Other Ambulatory Visit: Payer: Self-pay | Admitting: Adult Health

## 2023-01-21 DIAGNOSIS — F02B0 Dementia in other diseases classified elsewhere, moderate, without behavioral disturbance, psychotic disturbance, mood disturbance, and anxiety: Secondary | ICD-10-CM | POA: Diagnosis not present

## 2023-01-21 DIAGNOSIS — M6389 Disorders of muscle in diseases classified elsewhere, multiple sites: Secondary | ICD-10-CM | POA: Diagnosis not present

## 2023-01-21 DIAGNOSIS — R41841 Cognitive communication deficit: Secondary | ICD-10-CM | POA: Diagnosis not present

## 2023-01-21 DIAGNOSIS — R4189 Other symptoms and signs involving cognitive functions and awareness: Secondary | ICD-10-CM | POA: Diagnosis not present

## 2023-01-21 DIAGNOSIS — R278 Other lack of coordination: Secondary | ICD-10-CM | POA: Diagnosis not present

## 2023-01-21 DIAGNOSIS — Z9181 History of falling: Secondary | ICD-10-CM | POA: Diagnosis not present

## 2023-01-22 DIAGNOSIS — M6389 Disorders of muscle in diseases classified elsewhere, multiple sites: Secondary | ICD-10-CM | POA: Diagnosis not present

## 2023-01-22 DIAGNOSIS — R278 Other lack of coordination: Secondary | ICD-10-CM | POA: Diagnosis not present

## 2023-01-22 DIAGNOSIS — F02B0 Dementia in other diseases classified elsewhere, moderate, without behavioral disturbance, psychotic disturbance, mood disturbance, and anxiety: Secondary | ICD-10-CM | POA: Diagnosis not present

## 2023-01-22 DIAGNOSIS — R4189 Other symptoms and signs involving cognitive functions and awareness: Secondary | ICD-10-CM | POA: Diagnosis not present

## 2023-01-22 DIAGNOSIS — R41841 Cognitive communication deficit: Secondary | ICD-10-CM | POA: Diagnosis not present

## 2023-01-22 DIAGNOSIS — Z9181 History of falling: Secondary | ICD-10-CM | POA: Diagnosis not present

## 2023-01-24 ENCOUNTER — Non-Acute Institutional Stay (SKILLED_NURSING_FACILITY): Payer: Medicare Other | Admitting: Adult Health

## 2023-01-24 ENCOUNTER — Encounter: Payer: Self-pay | Admitting: Adult Health

## 2023-01-24 DIAGNOSIS — M6389 Disorders of muscle in diseases classified elsewhere, multiple sites: Secondary | ICD-10-CM | POA: Diagnosis not present

## 2023-01-24 DIAGNOSIS — R41841 Cognitive communication deficit: Secondary | ICD-10-CM | POA: Diagnosis not present

## 2023-01-24 DIAGNOSIS — F02B0 Dementia in other diseases classified elsewhere, moderate, without behavioral disturbance, psychotic disturbance, mood disturbance, and anxiety: Secondary | ICD-10-CM | POA: Diagnosis not present

## 2023-01-24 DIAGNOSIS — D709 Neutropenia, unspecified: Secondary | ICD-10-CM | POA: Diagnosis not present

## 2023-01-24 DIAGNOSIS — N400 Enlarged prostate without lower urinary tract symptoms: Secondary | ICD-10-CM

## 2023-01-24 DIAGNOSIS — D696 Thrombocytopenia, unspecified: Secondary | ICD-10-CM

## 2023-01-24 DIAGNOSIS — F02B11 Dementia in other diseases classified elsewhere, moderate, with agitation: Secondary | ICD-10-CM

## 2023-01-24 DIAGNOSIS — R4189 Other symptoms and signs involving cognitive functions and awareness: Secondary | ICD-10-CM | POA: Diagnosis not present

## 2023-01-24 DIAGNOSIS — G301 Alzheimer's disease with late onset: Secondary | ICD-10-CM | POA: Diagnosis not present

## 2023-01-24 DIAGNOSIS — I10 Essential (primary) hypertension: Secondary | ICD-10-CM | POA: Diagnosis not present

## 2023-01-24 DIAGNOSIS — Z9181 History of falling: Secondary | ICD-10-CM | POA: Diagnosis not present

## 2023-01-24 DIAGNOSIS — R278 Other lack of coordination: Secondary | ICD-10-CM | POA: Diagnosis not present

## 2023-01-24 NOTE — Progress Notes (Unsigned)
Location:   [facility] Room Number: 311 A Place of Service:  SNF [phone]) Provider:  Lenoria Chime, NP  Mahlon Gammon, MD  Patient Care Team: Mahlon Gammon, MD as PCP - General (Internal Medicine) Sinda Du, MD as Consulting Physician (Ophthalmology) Raeanne Barry, DMD (Dentistry)  Extended Emergency Contact Information Primary Emergency Contact: o'brien,mary Mobile Phone: [phone] Relation: Spouse Preferred language: [place] Secondary Emergency Contact: ROBINSON,JULIA Mobile Phone: [phone] Relation: Daughter Interpreter needed? No  Code Status:  DNR Goals of care: Advanced Directive information    01/24/2023   12:40 PM  Advanced Directives  Does Patient Have a Medical Advance Directive? Yes  Type of Advance Directive Out of facility DNR (pink MOST or yellow form)  Does patient want to make changes to medical advance directive? No - Patient declined  Pre-existing out of facility DNR order (yellow form or pink MOST form) Pink MOST form placed in chart (order not valid for inpatient use)     Chief Complaint  Patient presents with   Medical Management of Chronic Issues    Routine follow up   Immunizations    COVID booster due   Quality Metric Gaps    Medicare Annual wellness due    HPI:  Noah Hughes is a 88 y.o. male seen today for medical management of chronic diseases.    Dementia diagnosed as MCI in 2008 at [facility] progressed over time now in memory care at [facility]. Currently on namenda and exelon. He was seen by Dr Donell Beers for aggression, exit seeking behaviors. Now on Rexulti and Haldol.   Issues with sleeping, failed lunesta, now trying belsomra as of 01/18/23.  More issues with gait now. He has had several falls. No major injury but did have a skin tear.   He has a hx low white count and low platelets. He was referred to hematology and seen by Dr Truett Perna on 11/13/22.  He was not felt to be a candidate for bone marrow  bx. The differential includes myelodysplasia and cirrhosis due to heavy alcohol use.  He has no current symptoms  HTN on metoprolol 112/69  BPH: has incontinence and wears brief. No issues with retention or UTI for this visit. Hx of TURP.  Past Medical History:  Diagnosis Date   BPH (benign prostatic hyperplasia)    Chronic kidney disease    Dementia (HCC)    GERD (gastroesophageal reflux disease)    Hyperlipidemia    Hypertension    Past Surgical History:  Procedure Laterality Date   TRANSURETHRAL RESECTION OF PROSTATE      No Known Allergies  Allergies as of 01/24/2023   No Known Allergies      Medication List        Accurate as of January 24, 2023 12:51 PM. If you have any questions, ask your nurse or doctor.          STOP taking these medications    temazepam 30 MG capsule Commonly known as: RESTORIL Stopped by: Fletcher Anon       TAKE these medications    Belsomra 10 MG Tabs Generic drug: Suvorexant Take 10 mg by mouth daily in the afternoon.   brexpiprazole 2 MG Tabs tablet Commonly known as: REXULTI Take 2 mg by mouth at bedtime.   haloperidol 2 MG tablet Commonly known as: HALDOL Take 2 mg by mouth every 8 (eight) hours as needed.   haloperidol 2 MG tablet Commonly known as: HALDOL Take 2 mg by mouth daily.  At 4 pm   haloperidol lactate 5 MG/ML injection Commonly known as: HALDOL Inject 2 mg into the muscle every 8 (eight) hours as needed.   memantine 10 MG tablet Commonly known as: NAMENDA Take 10 mg by mouth 2 (two) times daily.   Metoprolol Succinate 25 MG Cs24 Take one tablet by mouth once daily.   multivitamin tablet Take 1 tablet by mouth daily.   pantoprazole 40 MG tablet Commonly known as: PROTONIX TAKE 1 TABLET EVERY DAY   rivastigmine 13.3 MG/24HR Commonly known as: EXELON Place 1 patch (13.3 mg total) onto the skin daily.   tamsulosin 0.4 MG Caps capsule Commonly known as: FLOMAX Take 1 capsule (0.4 mg total) by  mouth daily.   Vitamin B Complex Tabs Take 1 tablet by mouth daily.        Review of Systems  Unable to perform ROS: Dementia    Immunization History  Administered Date(s) Administered   Fluad Quad(high Dose 65+) 04/10/2022   Influenza-Unspecified 05/06/2020   Moderna Covid-19 Vaccine Bivalent Booster 43yrs & up 11/02/2022   Moderna SARS-COV2 Booster Vaccination 11/04/2020, 12/11/2021   Moderna Sars-Covid-2 Vaccination 07/21/2019, 08/18/2019, 05/25/2020, 04/21/2021   PNEUMOCOCCAL CONJUGATE-20 05/02/2022   Pneumococcal-Unspecified 06/22/2014   Td 07/09/2010   Tdap 11/14/2022   Zoster Recombinant(Shingrix) 07/09/2010, 05/09/2018   Pertinent  Health Maintenance Due  Topic Date Due   INFLUENZA VACCINE  02/07/2023      04/24/2022    9:21 AM 10/29/2022    1:18 PM 11/19/2022    3:34 PM  Fall Risk  Falls in the past year? 0 0 0  Was there an injury with Fall? 0 0 0  Fall Risk Category Calculator 0 0 0  Fall Risk Category (Retired) Low    (RETIRED) Patient Fall Risk Level Low fall risk    Patient at Risk for Falls Due to No Fall Risks No Fall Risks No Fall Risks  Fall risk Follow up Falls evaluation completed  Falls evaluation completed   Functional Status Survey:    Vitals:   01/24/23 1241  BP: 112/65  Pulse: 60  Resp: 18  Temp: (!) 97 F (36.1 C)  SpO2: 96%  Weight: 189 lb (85.7 kg)  Height: 5\' 9"  (1.753 m)   Body mass index is 27.91 kg/m. Physical Exam Vitals and nursing note reviewed.  Constitutional:      General: He is not in acute distress.    Appearance: He is not diaphoretic.  HENT:     Head: Normocephalic and atraumatic.  Neck:     Thyroid: No thyromegaly.     Vascular: No JVD.     Trachea: No tracheal deviation.  Cardiovascular:     Rate and Rhythm: Normal rate and regular rhythm.     Heart sounds: No murmur heard. Pulmonary:     Effort: Pulmonary effort is normal. No respiratory distress.     Breath sounds: Normal breath sounds. No  wheezing.  Abdominal:     General: Bowel sounds are normal. There is no distension.     Palpations: Abdomen is soft.     Tenderness: There is no abdominal tenderness.  Musculoskeletal:     Right lower leg: No edema.     Left lower leg: No edema.  Lymphadenopathy:     Cervical: No cervical adenopathy.  Skin:    General: Skin is warm and dry.  Neurological:     General: No focal deficit present.     Mental Status: He is alert. Mental status  is at baseline.  Psychiatric:        Mood and Affect: Mood normal.     Labs reviewed: Recent Labs    04/10/22 0000 10/23/22 0000 11/13/22 1319  NA 136* 137 135  K 4.9 4.6 4.4  CL 99 100 97*  CO2 27* 26* 31  GLUCOSE  --   --  121*  BUN 13 13 12   CREATININE 1.2 1.1 1.11  CALCIUM 10.0 9.6 9.9   Recent Labs    04/10/22 0000 10/23/22 0000 11/13/22 1319  AST 57* 46* 47*  ALT 40 32 31  ALKPHOS 118 112 88  BILITOT  --   --  1.6*  PROT  --   --  7.5  ALBUMIN 4.4 4.3 4.4   Recent Labs    10/23/22 0000 10/25/22 0000 11/13/22 1056  WBC 2.4 2.3 2.5*  NEUTROABS  --  1.20 1.5*  HGB 14.1 13.9 14.2  HCT 41  --  42.3  MCV  --   --  88.9  PLT 65* 68* 73*   Lab Results  Component Value Date   TSH 1.16 03/01/2022   No results found for: "HGBA1C" Lab Results  Component Value Date   CHOL 200 03/01/2022   HDL 85 (A) 03/01/2022   LDLCALC 96 03/01/2022   TRIG 95 03/01/2022    Significant Diagnostic Results in last 30 days:  No results found.  Assessment/Plan  1. 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 Essential hypertension Controlled continue metoprolol  3. Benign prostatic hyperplasia without lower urinary tract symptoms Continue flomax.   4. Thrombocytopenia (HCC) Lab Results  Component Value Date   PLT 73 (L) 11/13/2022  No current episodes of bleeding.  Followed by hematology has  apt in August with labs At that this time given his age and dementia with agitation, aggressive work up would not be warranted per goals of care conversation.    5. Neutropenia unspecified Lab Results  Component Value Date   WBC 2.5 (L) 11/13/2022   See above.     Labs/tests ordered:  will be done at hematology

## 2023-01-26 ENCOUNTER — Encounter: Payer: Self-pay | Admitting: Adult Health

## 2023-01-28 DIAGNOSIS — M6389 Disorders of muscle in diseases classified elsewhere, multiple sites: Secondary | ICD-10-CM | POA: Diagnosis not present

## 2023-01-28 DIAGNOSIS — Z9181 History of falling: Secondary | ICD-10-CM | POA: Diagnosis not present

## 2023-01-28 DIAGNOSIS — F02B0 Dementia in other diseases classified elsewhere, moderate, without behavioral disturbance, psychotic disturbance, mood disturbance, and anxiety: Secondary | ICD-10-CM | POA: Diagnosis not present

## 2023-01-28 DIAGNOSIS — R278 Other lack of coordination: Secondary | ICD-10-CM | POA: Diagnosis not present

## 2023-01-28 DIAGNOSIS — R4189 Other symptoms and signs involving cognitive functions and awareness: Secondary | ICD-10-CM | POA: Diagnosis not present

## 2023-01-28 DIAGNOSIS — R41841 Cognitive communication deficit: Secondary | ICD-10-CM | POA: Diagnosis not present

## 2023-01-29 DIAGNOSIS — R41841 Cognitive communication deficit: Secondary | ICD-10-CM | POA: Diagnosis not present

## 2023-01-29 DIAGNOSIS — M6389 Disorders of muscle in diseases classified elsewhere, multiple sites: Secondary | ICD-10-CM | POA: Diagnosis not present

## 2023-01-29 DIAGNOSIS — R4189 Other symptoms and signs involving cognitive functions and awareness: Secondary | ICD-10-CM | POA: Diagnosis not present

## 2023-01-29 DIAGNOSIS — R278 Other lack of coordination: Secondary | ICD-10-CM | POA: Diagnosis not present

## 2023-01-29 DIAGNOSIS — Z9181 History of falling: Secondary | ICD-10-CM | POA: Diagnosis not present

## 2023-01-29 DIAGNOSIS — F02B0 Dementia in other diseases classified elsewhere, moderate, without behavioral disturbance, psychotic disturbance, mood disturbance, and anxiety: Secondary | ICD-10-CM | POA: Diagnosis not present

## 2023-01-31 DIAGNOSIS — R278 Other lack of coordination: Secondary | ICD-10-CM | POA: Diagnosis not present

## 2023-01-31 DIAGNOSIS — M6389 Disorders of muscle in diseases classified elsewhere, multiple sites: Secondary | ICD-10-CM | POA: Diagnosis not present

## 2023-01-31 DIAGNOSIS — F02B0 Dementia in other diseases classified elsewhere, moderate, without behavioral disturbance, psychotic disturbance, mood disturbance, and anxiety: Secondary | ICD-10-CM | POA: Diagnosis not present

## 2023-01-31 DIAGNOSIS — Z9181 History of falling: Secondary | ICD-10-CM | POA: Diagnosis not present

## 2023-01-31 DIAGNOSIS — R41841 Cognitive communication deficit: Secondary | ICD-10-CM | POA: Diagnosis not present

## 2023-01-31 DIAGNOSIS — R4189 Other symptoms and signs involving cognitive functions and awareness: Secondary | ICD-10-CM | POA: Diagnosis not present

## 2023-02-01 ENCOUNTER — Encounter: Payer: Self-pay | Admitting: Adult Health

## 2023-02-01 ENCOUNTER — Non-Acute Institutional Stay (INDEPENDENT_AMBULATORY_CARE_PROVIDER_SITE_OTHER): Payer: Medicare Other | Admitting: Adult Health

## 2023-02-01 DIAGNOSIS — Z0000 Encounter for general adult medical examination without abnormal findings: Secondary | ICD-10-CM | POA: Diagnosis not present

## 2023-02-01 NOTE — Progress Notes (Signed)
Subjective:   Noah Hughes is a 88 y.o. male who presents for Medicare Annual/Subsequent preventive examination at [facility] in skilled care  Visit Complete: In person  Patient Medicare AWV questionnaire was filled out by me mostly as the pt has dementia but I did review the records and attempt to have him answer questions.  Review of Systems           Objective:    Today's Vitals   02/01/23 0942  BP: 126/66  Pulse: 63  Resp: 18  Temp: 97.9 F (36.6 C)  TempSrc: Temporal  SpO2: 94%  Weight: 189 lb (85.7 kg)  Height: 5\' 9"  (1.753 m)   Body mass index is 27.91 kg/m.     02/01/2023    9:58 AM 01/24/2023   12:40 PM 12/28/2022   10:28 AM 12/13/2022   12:14 PM 11/26/2022    9:21 AM 11/19/2022    3:15 PM 11/13/2022   11:36 AM  Advanced Directives  Does Patient Have a Medical Advance Directive? Yes Yes Yes Yes Yes Yes No  Type of Advance Directive Out of facility DNR (pink MOST or yellow form) Out of facility DNR (pink MOST or yellow form) Out of facility DNR (pink MOST or yellow form) Healthcare Power of [place];Out of facility DNR (pink MOST or yellow form) Healthcare Power of [place];Living will;Out of facility DNR (pink MOST or yellow form) Healthcare Power of [place];Living will;Out of facility DNR (pink MOST or yellow form)   Does patient want to make changes to medical advance directive? No - Patient declined No - Patient declined No - Patient declined No - Patient declined No - Patient declined No - Patient declined No - Patient declined  Copy of Healthcare Power of Attorney in Chart? No - copy requested   No - copy requested No - copy requested No - copy requested   Would patient like information on creating a medical advance directive?     No - Patient declined No - Patient declined No - Patient declined  Pre-existing out of facility DNR order (yellow form or pink MOST form) Pink MOST form placed in chart (order not valid for inpatient use) Pink MOST form placed in chart  (order not valid for inpatient use) Pink MOST form placed in chart (order not valid for inpatient use)        Current Medications (verified) Outpatient Encounter Medications as of 02/01/2023  Medication Sig   B Complex Vitamins (VITAMIN B COMPLEX) TABS Take 1 tablet by mouth daily.   brexpiprazole (REXULTI) 2 MG TABS tablet Take 2 mg by mouth at bedtime.   haloperidol (HALDOL) 2 MG tablet Take 2 mg by mouth every 8 (eight) hours as needed.   haloperidol (HALDOL) 2 MG tablet Take 2 mg by mouth daily. At 4 pm   memantine (NAMENDA) 10 MG tablet Take 10 mg by mouth 2 (two) times daily.   Metoprolol Succinate 25 MG CS24 Take one tablet by mouth once daily.   Multiple Vitamin (MULTIVITAMIN) tablet Take 1 tablet by mouth daily.   pantoprazole (PROTONIX) 40 MG tablet TAKE 1 TABLET EVERY DAY   rivastigmine (EXELON) 13.3 MG/24HR Place 1 patch (13.3 mg total) onto the skin daily.   Suvorexant (BELSOMRA) 10 MG TABS Take 10 mg by mouth daily in the afternoon.   tamsulosin (FLOMAX) 0.4 MG CAPS capsule Take 1 capsule (0.4 mg total) by mouth daily.   [DISCONTINUED] haloperidol lactate (HALDOL) 5 MG/ML injection Inject 2 mg into the muscle every 8 (  eight) hours as needed. (Patient not taking: Reported on 02/01/2023)   No facility-administered encounter medications on file as of 02/01/2023.    Allergies (verified) Patient has no known allergies.   History: Past Medical History:  Diagnosis Date   BPH (benign prostatic hyperplasia)    Chronic kidney disease    Dementia (HCC)    GERD (gastroesophageal reflux disease)    Hyperlipidemia    Hypertension    Past Surgical History:  Procedure Laterality Date   TRANSURETHRAL RESECTION OF PROSTATE     History reviewed. No pertinent family history. Social History   Socioeconomic History   Marital status: Unknown    Spouse name: Not on file   Number of children: Not on file   Years of education: Not on file   Highest education level: Not on file   Occupational History   Not on file  Tobacco Use   Smoking status: Former    Current packs/day: 0.00    Types: Cigarettes    Quit date: 13    Years since quitting: 52.6   Smokeless tobacco: Never  Substance and Sexual Activity   Alcohol use: Yes   Drug use: Never   Sexual activity: Not Currently  Other Topics Concern   Not on file  Social History Narrative   Not on file   Social Determinants of Health   Financial Resource Strain: Low Risk  (11/13/2022)   Overall Financial Resource Strain (CARDIA)    Difficulty of Paying Living Expenses: Not hard at all  Food Insecurity: No Food Insecurity (11/13/2022)   Hunger Vital Sign    Worried About Running Out of Food in the Last Year: Never true    Ran Out of Food in the Last Year: Never true  Transportation Needs: No Transportation Needs (11/13/2022)   PRAPARE - Administrator, Civil Service (Medical): No    Lack of Transportation (Non-Medical): No  Physical Activity: Sufficiently Active (11/13/2022)   Exercise Vital Sign    Days of Exercise per Week: 5 days    Minutes of Exercise per Session: 30 min  Stress: No Stress Concern Present (11/13/2022)   [employer] of Occupational Health - Occupational Stress Questionnaire    Feeling of Stress : Not at all  Social Connections: Socially Integrated (11/13/2022)   Social Connection and Isolation Panel [NHANES]    Frequency of Communication with Friends and Family: Twice a week    Frequency of Social Gatherings with Friends and Family: Twice a week    Attends Religious Services: 1 to 4 times per year    Active Member of [employer] or Organizations: Yes    Attends Banker Meetings: 1 to 4 times per year    Marital Status: Married    Tobacco Counseling Counseling given: Not Answered   Clinical Intake:  Pre-visit preparation completed: No  Pain : No/denies pain     BMI - recorded: 27.9 Nutritional Status: BMI 25 -29 Overweight Nutritional Risks:  None Diabetes: No  How often do you need to have someone help you when you read instructions, pamphlets, or other written materials from your doctor or pharmacy?: 4 - Often  Interpreter Needed?: No  Information entered by :: Fletcher Anon NP   Activities of Daily Living     No data to display          Patient Care Team: Mahlon Gammon, MD as PCP - General (Internal Medicine) Sinda Du, MD as Consulting Physician (Ophthalmology) Raeanne Barry, DMD (  Dentistry)  Indicate any recent Medical Services you may have received from other than [facility] providers in the past year (date may be approximate).     Assessment:   This is a routine wellness examination for [place].  Hearing/Vision screen No results found.  Dietary issues and exercise activities discussed:     Goals Addressed             This Visit's Progress    Behavior Symptoms Management       Evidence-based guidance:  Assess for behavior changes by interviewing patient and family/caregiver (informant) using a standardized tool when possible.  Assess for signs/symptoms of underlying condition such as urinary tract infection, unmanaged pain or side effects of pharmacologic therapy when new or worsening agitation appears.  Identify triggers or exacerbating factors such as environmental changes, medication, depression or psychosis.  If behavior is dangerous to patient or family/caregiver and cannot be controlled, consider pharmacologic management, professional in-home care or hospitalization.  Prepare patient and family/caregiver for time-limited pharmacologic therapy that is prescribed only when behavior symptoms are severe and after careful assessment of the risks, benefits and type of dementia.  Monitor efficacy of pharmacologic therapy that may include antipsychotic for aggression, psychosis and agitation or SSRI (selective serotonin reuptake inhibitor) for mood or depressive symptoms.  When pharmacologic  therapy has been tapered, assess symptoms monthly for at least 4 months after discontinuation to identify recurrence of symptoms.  Use nonpharmacologic approaches to manage behavior including referral to mental health provider for behavior therapy and psychoeducation for patient and caregiver.  Encourage use of complementary therapy directed at behavior and mood management such as aromatherapy, muscle-relaxation training, massage or therapeutic touch, animal-assisted therapy and music therapy.  When pain is present, mutually develop an interdisciplinary and multimodal pain management plan with patient and family/caregiver; track the patient's response to the pain management plan.  Initiate individualized nonpharmacologic pain management measures that may include physical rehabilitation, cognitive behavior therapy, guided imagery, massage, distraction, yoga, relaxation or chiropractic manipulation.  Encourage use of local anesthetic or analgesic therapy as an adjunct for pain control such as lidocaine patch, capsaicin cream or topical nonsteroidal anti-inflammatory agent.  Prepare patient for use of pharmacologic therapy in a stepped approach that may include acetaminophen, nonsteroidal anti-inflammatory, opioid, antiepileptic or antidepressant.  Review efficacy, tolerability and adherence of pharmacologic therapy; manage medication-induced side effects.   Notes:        Depression Screen    02/01/2023   11:18 AM 11/19/2022    3:34 PM 11/13/2022   11:40 AM 10/29/2022    1:18 PM 04/24/2022    9:21 AM  PHQ 2/9 Scores  PHQ - 2 Score 0 1 0 0 0    Fall Risk    02/01/2023   11:17 AM 11/19/2022    3:34 PM 10/29/2022    1:18 PM 04/24/2022    9:21 AM  Fall Risk   Falls in the past year? 1 0 0 0  Number falls in past yr: 1 0 0 0  Injury with Fall? 1 0 0 0  Risk for fall due to : History of fall(s) No Fall Risks No Fall Risks No Fall Risks  Follow up Falls evaluation completed Falls evaluation  completed  Falls evaluation completed    MEDICARE RISK AT HOME:  Medicare Risk at Home - 02/01/23 1118     Any stairs in or around the home? No    If so, are there any without handrails? No    Home free  of loose throw rugs in walkways, pet beds, electrical cords, etc? Yes    Adequate lighting in your home to reduce risk of falls? Yes    Life alert? No    Use of a cane, walker or w/c? Yes    Grab bars in the bathroom? Yes    Shower chair or bench in shower? Yes    Elevated toilet seat or a handicapped toilet? Yes             TIMED UP AND GO:  Was the test performed?  No    Cognitive Function:    04/24/2022    9:28 AM  MMSE - Mini Mental State Exam  Orientation to time 0  Orientation to Place 0  Registration 0  Attention/ Calculation 3  Recall 0  Language- name 2 objects 0  Language- repeat 0  Language- follow 3 step command 3  Language- read & follow direction 0  Write a sentence 0  Copy design 0  Total score 6        Immunizations Immunization History  Administered Date(s) Administered   Fluad Quad(high Dose 65+) 04/10/2022   Influenza-Unspecified 05/06/2020   Moderna Covid-19 Vaccine Bivalent Booster 68yrs & up 11/02/2022   Moderna SARS-COV2 Booster Vaccination 11/04/2020, 12/11/2021   Moderna Sars-Covid-2 Vaccination 07/21/2019, 08/18/2019, 05/25/2020, 04/21/2021   PNEUMOCOCCAL CONJUGATE-20 05/02/2022   Pneumococcal-Unspecified 06/22/2014   Td 07/09/2010   Tdap 11/14/2022   Zoster Recombinant(Shingrix) 07/09/2010, 05/09/2018    TDAP status: Up to date  Flu Vaccine status: Up to date  Pneumococcal vaccine status: Up to date  Covid-19 vaccine status: Completed vaccines  Qualifies for Shingles Vaccine? Yes   Zostavax completed Yes   Shingrix Completed?: Yes  Screening Tests Health Maintenance  Topic Date Due   Medicare Annual Wellness (AWV)  03/13/2019   COVID-19 Vaccine (6 - 2023-24 season) 02/17/2023 (Originally 12/28/2022)   INFLUENZA  VACCINE  02/07/2023   DTaP/Tdap/Td (3 - Td or Tdap) 11/13/2032   Pneumonia Vaccine 57+ Years old  Completed   Zoster Vaccines- Shingrix  Completed   HPV VACCINES  Aged Out    Health Maintenance  Health Maintenance Due  Topic Date Due   Medicare Annual Wellness (AWV)  03/13/2019    Colorectal cancer screening: No longer required.   Lung Cancer Screening: (Low Dose CT Chest recommended if Age 86-80 years, 20 pack-year currently smoking OR have quit w/in 15years.) does not qualify.   Lung Cancer Screening Referral: na  Additional Screening:  Hepatitis C Screening: does not qualify; Completed NA  Vision Screening: Recommended annual ophthalmology exams for early detection of glaucoma and other disorders of the eye. Is the patient up to date with their annual eye exam?  Yes  Who is the provider or what is the name of the office in which the patient attends annual eye exams? At this time due to his dementia with behaviors I would not recommend further exams at this time If pt is not established with a provider, would they like to be referred to a provider to establish care? No .   Dental Screening: Recommended annual dental exams for proper oral hygiene  Diabetic Foot Exam: not diabetic  Community Resource Referral / Chronic Care Management: CRR required this visit?  No   CCM required this visit?  No     Plan:     I have personally reviewed and noted the following in the patient's chart:   Medical and social history Use of alcohol, tobacco or  illicit drugs  Current medications and supplements including opioid prescriptions. Patient is not currently taking opioid prescriptions. Functional ability and status Nutritional status Physical activity Advanced directives List of other physicians Hospitalizations, surgeries, and ER visits in previous 12 months Vitals Screenings to include cognitive, depression, and falls Referrals and appointments  In addition, I have  reviewed and discussed with patient certain preventive protocols, quality metrics, and best practice recommendations. A written personalized care plan for preventive services as well as general preventive health recommendations were provided to patient.     Fletcher Anon, NP   02/01/2023   After Visit Summary: fax to [facility]  Nurse Notes: NA

## 2023-02-01 NOTE — Patient Instructions (Signed)
Noah Hughes , Thank you for taking time to come for your Medicare Wellness Visit. I appreciate your ongoing commitment to your health goals. Please review the following plan we discussed and let me know if I can assist you in the future.   Screening recommendations/referrals: Colonoscopy aged out Recommended yearly ophthalmology/optometry visit for glaucoma screening and checkup Recommended yearly dental visit for hygiene and checkup  Vaccinations: Influenza vaccine due annually in September/October Pneumococcal vaccine up to date Tdap vaccine up to date Shingles vaccine up to date    Advanced directives: reviewed  Conditions/risks identified: fall risk  Next appointment:  1 year   Preventive Care 7 Years and Older, Male Preventive care refers to lifestyle choices and visits with your health care provider that can promote health and wellness. What does preventive care include? A yearly physical exam. This is also called an annual well check. Dental exams once or twice a year. Routine eye exams. Ask your health care provider how often you should have your eyes checked. Personal lifestyle choices, including: Daily care of your teeth and gums. Regular physical activity. Eating a healthy diet. Avoiding tobacco and drug use. Limiting alcohol use. Practicing safe sex. Taking low doses of aspirin every day. Taking vitamin and mineral supplements as recommended by your health care provider. What happens during an annual well check? The services and screenings done by your health care provider during your annual well check will depend on your age, overall health, lifestyle risk factors, and family history of disease. Counseling  Your health care provider may ask you questions about your: Alcohol use. Tobacco use. Drug use. Emotional well-being. Home and relationship well-being. Sexual activity. Eating habits. History of falls. Memory and ability to understand (cognition). Work  and work Astronomer. Screening  You may have the following tests or measurements: Height, weight, and BMI. Blood pressure. Lipid and cholesterol levels. These may be checked every 5 years, or more frequently if you are over 21 years old. Skin check. Lung cancer screening. You may have this screening every year starting at age 76 if you have a 30-pack-year history of smoking and currently smoke or have quit within the past 15 years. Fecal occult blood test (FOBT) of the stool. You may have this test every year starting at age 53. Flexible sigmoidoscopy or colonoscopy. You may have a sigmoidoscopy every 5 years or a colonoscopy every 10 years starting at age 14. Prostate cancer screening. Recommendations will vary depending on your family history and other risks. Hepatitis C blood test. Hepatitis B blood test. Sexually transmitted disease (STD) testing. Diabetes screening. This is done by checking your blood sugar (glucose) after you have not eaten for a while (fasting). You may have this done every 1-3 years. Abdominal aortic aneurysm (AAA) screening. You may need this if you are a current or former smoker. Osteoporosis. You may be screened starting at age 62 if you are at high risk. Talk with your health care provider about your test results, treatment options, and if necessary, the need for more tests. Vaccines  Your health care provider may recommend certain vaccines, such as: Influenza vaccine. This is recommended every year. Tetanus, diphtheria, and acellular pertussis (Tdap, Td) vaccine. You may need a Td booster every 10 years. Zoster vaccine. You may need this after age 37. Pneumococcal 13-valent conjugate (PCV13) vaccine. One dose is recommended after age 31. Pneumococcal polysaccharide (PPSV23) vaccine. One dose is recommended after age 4. Talk to your health care provider about which screenings and  vaccines you need and how often you need them. This information is not intended  to replace advice given to you by your health care provider. Make sure you discuss any questions you have with your health care provider. Document Released: 07/22/2015 Document Revised: 03/14/2016 Document Reviewed: 04/26/2015 Elsevier Interactive Patient Education  2017 [employer].  Fall Prevention in the Home Falls can cause injuries. They can happen to people of all ages. There are many things you can do to make your home safe and to help prevent falls. What can I do on the outside of my home? Regularly fix the edges of walkways and driveways and fix any cracks. Remove anything that might make you trip as you walk through a door, such as a raised step or threshold. Trim any bushes or trees on the path to your home. Use bright outdoor lighting. Clear any walking paths of anything that might make someone trip, such as rocks or tools. Regularly check to see if handrails are loose or broken. Make sure that both sides of any steps have handrails. Any raised decks and porches should have guardrails on the edges. Have any leaves, snow, or ice cleared regularly. Use sand or salt on walking paths during winter. Clean up any spills in your garage right away. This includes oil or grease spills. What can I do in the bathroom? Use night lights. Install grab bars by the toilet and in the tub and shower. Do not use towel bars as grab bars. Use non-skid mats or decals in the tub or shower. If you need to sit down in the shower, use a plastic, non-slip stool. Keep the floor dry. Clean up any water that spills on the floor as soon as it happens. Remove soap buildup in the tub or shower regularly. Attach bath mats securely with double-sided non-slip rug tape. Do not have throw rugs and other things on the floor that can make you trip. What can I do in the bedroom? Use night lights. Make sure that you have a light by your bed that is easy to reach. Do not use any sheets or blankets that are too big  for your bed. They should not hang down onto the floor. Have a firm chair that has side arms. You can use this for support while you get dressed. Do not have throw rugs and other things on the floor that can make you trip. What can I do in the kitchen? Clean up any spills right away. Avoid walking on wet floors. Keep items that you use a lot in easy-to-reach places. If you need to reach something above you, use a strong step stool that has a grab bar. Keep electrical cords out of the way. Do not use floor polish or wax that makes floors slippery. If you must use wax, use non-skid floor wax. Do not have throw rugs and other things on the floor that can make you trip. What can I do with my stairs? Do not leave any items on the stairs. Make sure that there are handrails on both sides of the stairs and use them. Fix handrails that are broken or loose. Make sure that handrails are as long as the stairways. Check any carpeting to make sure that it is firmly attached to the stairs. Fix any carpet that is loose or worn. Avoid having throw rugs at the top or bottom of the stairs. If you do have throw rugs, attach them to the floor with carpet tape. Make  sure that you have a light switch at the top of the stairs and the bottom of the stairs. If you do not have them, ask someone to add them for you. What else can I do to help prevent falls? Wear shoes that: Do not have high heels. Have rubber bottoms. Are comfortable and fit you well. Are closed at the toe. Do not wear sandals. If you use a stepladder: Make sure that it is fully opened. Do not climb a closed stepladder. Make sure that both sides of the stepladder are locked into place. Ask someone to hold it for you, if possible. Clearly mark and make sure that you can see: Any grab bars or handrails. First and last steps. Where the edge of each step is. Use tools that help you move around (mobility aids) if they are needed. These  include: Canes. Walkers. Scooters. Crutches. Turn on the lights when you go into a dark area. Replace any light bulbs as soon as they burn out. Set up your furniture so you have a clear path. Avoid moving your furniture around. If any of your floors are uneven, fix them. If there are any pets around you, be aware of where they are. Review your medicines with your doctor. Some medicines can make you feel dizzy. This can increase your chance of falling. Ask your doctor what other things that you can do to help prevent falls. This information is not intended to replace advice given to you by your health care provider. Make sure you discuss any questions you have with your health care provider. Document Released: 04/21/2009 Document Revised: 12/01/2015 Document Reviewed: 07/30/2014 Elsevier Interactive Patient Education  2017 [employer].

## 2023-02-04 DIAGNOSIS — F332 Major depressive disorder, recurrent severe without psychotic features: Secondary | ICD-10-CM | POA: Diagnosis not present

## 2023-02-05 DIAGNOSIS — Z9181 History of falling: Secondary | ICD-10-CM | POA: Diagnosis not present

## 2023-02-05 DIAGNOSIS — F02B0 Dementia in other diseases classified elsewhere, moderate, without behavioral disturbance, psychotic disturbance, mood disturbance, and anxiety: Secondary | ICD-10-CM | POA: Diagnosis not present

## 2023-02-05 DIAGNOSIS — R4189 Other symptoms and signs involving cognitive functions and awareness: Secondary | ICD-10-CM | POA: Diagnosis not present

## 2023-02-05 DIAGNOSIS — R278 Other lack of coordination: Secondary | ICD-10-CM | POA: Diagnosis not present

## 2023-02-05 DIAGNOSIS — M6389 Disorders of muscle in diseases classified elsewhere, multiple sites: Secondary | ICD-10-CM | POA: Diagnosis not present

## 2023-02-05 DIAGNOSIS — R41841 Cognitive communication deficit: Secondary | ICD-10-CM | POA: Diagnosis not present

## 2023-02-07 DIAGNOSIS — R278 Other lack of coordination: Secondary | ICD-10-CM | POA: Diagnosis not present

## 2023-02-07 DIAGNOSIS — R4189 Other symptoms and signs involving cognitive functions and awareness: Secondary | ICD-10-CM | POA: Diagnosis not present

## 2023-02-07 DIAGNOSIS — M6389 Disorders of muscle in diseases classified elsewhere, multiple sites: Secondary | ICD-10-CM | POA: Diagnosis not present

## 2023-02-07 DIAGNOSIS — Z9181 History of falling: Secondary | ICD-10-CM | POA: Diagnosis not present

## 2023-02-07 DIAGNOSIS — F02B0 Dementia in other diseases classified elsewhere, moderate, without behavioral disturbance, psychotic disturbance, mood disturbance, and anxiety: Secondary | ICD-10-CM | POA: Diagnosis not present

## 2023-02-08 DIAGNOSIS — F02B0 Dementia in other diseases classified elsewhere, moderate, without behavioral disturbance, psychotic disturbance, mood disturbance, and anxiety: Secondary | ICD-10-CM | POA: Diagnosis not present

## 2023-02-08 DIAGNOSIS — R278 Other lack of coordination: Secondary | ICD-10-CM | POA: Diagnosis not present

## 2023-02-08 DIAGNOSIS — M6389 Disorders of muscle in diseases classified elsewhere, multiple sites: Secondary | ICD-10-CM | POA: Diagnosis not present

## 2023-02-08 DIAGNOSIS — R4189 Other symptoms and signs involving cognitive functions and awareness: Secondary | ICD-10-CM | POA: Diagnosis not present

## 2023-02-08 DIAGNOSIS — Z9181 History of falling: Secondary | ICD-10-CM | POA: Diagnosis not present

## 2023-02-11 ENCOUNTER — Non-Acute Institutional Stay (SKILLED_NURSING_FACILITY): Payer: Medicare Other | Admitting: Internal Medicine

## 2023-02-11 ENCOUNTER — Encounter: Payer: Self-pay | Admitting: Internal Medicine

## 2023-02-11 DIAGNOSIS — G301 Alzheimer's disease with late onset: Secondary | ICD-10-CM

## 2023-02-11 DIAGNOSIS — D696 Thrombocytopenia, unspecified: Secondary | ICD-10-CM | POA: Diagnosis not present

## 2023-02-11 DIAGNOSIS — F02B0 Dementia in other diseases classified elsewhere, moderate, without behavioral disturbance, psychotic disturbance, mood disturbance, and anxiety: Secondary | ICD-10-CM | POA: Diagnosis not present

## 2023-02-11 DIAGNOSIS — R278 Other lack of coordination: Secondary | ICD-10-CM | POA: Diagnosis not present

## 2023-02-11 DIAGNOSIS — R600 Localized edema: Secondary | ICD-10-CM | POA: Diagnosis not present

## 2023-02-11 DIAGNOSIS — I10 Essential (primary) hypertension: Secondary | ICD-10-CM

## 2023-02-11 DIAGNOSIS — F02B11 Dementia in other diseases classified elsewhere, moderate, with agitation: Secondary | ICD-10-CM | POA: Diagnosis not present

## 2023-02-11 DIAGNOSIS — R4189 Other symptoms and signs involving cognitive functions and awareness: Secondary | ICD-10-CM | POA: Diagnosis not present

## 2023-02-11 DIAGNOSIS — M6389 Disorders of muscle in diseases classified elsewhere, multiple sites: Secondary | ICD-10-CM | POA: Diagnosis not present

## 2023-02-11 DIAGNOSIS — F5101 Primary insomnia: Secondary | ICD-10-CM | POA: Diagnosis not present

## 2023-02-11 DIAGNOSIS — N400 Enlarged prostate without lower urinary tract symptoms: Secondary | ICD-10-CM | POA: Diagnosis not present

## 2023-02-11 DIAGNOSIS — Z9181 History of falling: Secondary | ICD-10-CM | POA: Diagnosis not present

## 2023-02-11 NOTE — Progress Notes (Signed)
Location:  [facility] Room Number: NO/311/A Place of Service:  SNF [phone]) Provider:  Mahlon Gammon, MD  Patient Care Team: Mahlon Gammon, MD as PCP - General (Internal Medicine) Sinda Du, MD as Consulting Physician (Ophthalmology) Raeanne Barry, DMD (Dentistry)  Extended Emergency Contact Information Primary Emergency Contact: o'brien,mary Mobile Phone: [phone] Relation: Spouse Preferred language: English Secondary Emergency Contact: ROBINSON,JULIA Mobile Phone: [phone] Relation: Daughter Interpreter needed? No  Code Status:  DNR Goals of care: Advanced Directive information    02/11/2023   11:30 AM  Advanced Directives  Does Patient Have a Medical Advance Directive? Yes  Type of Advance Directive Out of facility DNR (pink MOST or yellow form)  Does patient want to make changes to medical advance directive? No - Patient declined  Copy of Healthcare Power of Attorney in Chart? No - copy requested  Pre-existing out of facility DNR order (yellow form or pink MOST form) Pink MOST form placed in chart (order not valid for inpatient use)     Chief Complaint  Patient presents with   Quality Metric Gaps    Patient is due for updated influenza vaccine    HPI:  Pt is a 88 y.o. male seen today for medical management of chronic diseases.   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  BPH,and HTN  Wt Readings from Last 3 Encounters:  02/11/23 183 lb 4.8 oz (83.1 kg)  02/01/23 189 lb (85.7 kg)  01/24/23 189 lb (85.7 kg)    Continuous to have behavior issues   Past Medical History:  Diagnosis Date   BPH (benign prostatic hyperplasia)    Chronic kidney disease    Dementia (HCC)    GERD (gastroesophageal reflux  disease)    Hyperlipidemia    Hypertension    Past Surgical History:  Procedure Laterality Date   TRANSURETHRAL RESECTION OF PROSTATE      No Known Allergies  Outpatient Encounter Medications as of 02/11/2023  Medication Sig   B Complex Vitamins (VITAMIN B COMPLEX) TABS Take 1 tablet by mouth daily.   brexpiprazole (REXULTI) 2 MG TABS tablet Take 2 mg by mouth at bedtime.   haloperidol (HALDOL) 2 MG tablet Take 2 mg by mouth every 8 (eight) hours as needed.   haloperidol (HALDOL) 2 MG tablet Take 2 mg by mouth daily. At 4 pm   memantine (NAMENDA) 10 MG tablet Take 10 mg by mouth 2 (two) times daily.   Metoprolol Succinate 25 MG CS24 Take one tablet by mouth once daily.   Multiple Vitamin (MULTIVITAMIN) tablet Take 1 tablet by mouth daily.   pantoprazole (PROTONIX) 40 MG tablet TAKE 1 TABLET EVERY DAY   rivastigmine (EXELON) 13.3 MG/24HR Place 1 patch (13.3 mg total) onto the skin daily.   Suvorexant (BELSOMRA) 10 MG TABS Take 10 mg by mouth daily in the afternoon.   tamsulosin (FLOMAX) 0.4 MG CAPS capsule Take 1 capsule (0.4 mg total) by mouth daily.   temazepam (RESTORIL) 15 MG capsule Take 2 capsules by mouth at bedtime as needed for sleep.   No facility-administered encounter medications on file as of 02/11/2023.    Review of Systems  Immunization History  Administered Date(s) Administered   Fluad Quad(high Dose 65+) 04/10/2022   Influenza-Unspecified 05/06/2020   Moderna Covid-19 Vaccine  Bivalent Booster 99yrs & up 11/02/2022   Moderna SARS-COV2 Booster Vaccination 11/04/2020, 12/11/2021   Moderna Sars-Covid-2 Vaccination 07/21/2019, 08/18/2019, 05/25/2020, 04/21/2021   PNEUMOCOCCAL CONJUGATE-20 05/02/2022   Pneumococcal-Unspecified 06/22/2014   Td 07/09/2010   Tdap 11/14/2022   Zoster Recombinant(Shingrix) 07/09/2010, 05/09/2018   Pertinent  Health Maintenance Due  Topic Date Due   INFLUENZA VACCINE  02/07/2023      04/24/2022    9:21 AM 10/29/2022    1:18 PM  11/19/2022    3:34 PM 02/01/2023   11:17 AM 02/11/2023   11:31 AM  Fall Risk  Falls in the past year? 0 0 0 1 1  Was there an injury with Fall? 0 0 0 1 1  Fall Risk Category Calculator 0 0 0 3 3  Fall Risk Category (Retired) Low      (RETIRED) Patient Fall Risk Level Low fall risk      Patient at Risk for Falls Due to No Fall Risks No Fall Risks No Fall Risks History of fall(s) History of fall(s);Impaired balance/gait;Impaired mobility  Fall risk Follow up Falls evaluation completed  Falls evaluation completed Falls evaluation completed Falls evaluation completed   Functional Status Survey:    Vitals:   02/11/23 1132  BP: 130/67  Pulse: (!) 51  Resp: 20  Temp: (!) 96.9 F (36.1 C)  SpO2: 96%  Weight: 183 lb 4.8 oz (83.1 kg)  Height: 5\' 9"  (1.753 m)   Body mass index is 27.07 kg/m. Physical Exam Vitals reviewed.  Constitutional:      Appearance: Normal appearance.  HENT:     Head: Normocephalic.     Nose: Nose normal.     Mouth/Throat:     Mouth: Mucous membranes are moist.     Pharynx: Oropharynx is clear.  Eyes:     Pupils: Pupils are equal, round, and reactive to light.  Cardiovascular:     Rate and Rhythm: Normal rate and regular rhythm.     Pulses: Normal pulses.     Heart sounds: No murmur heard. Pulmonary:     Effort: Pulmonary effort is normal. No respiratory distress.     Breath sounds: Normal breath sounds. No rales.  Abdominal:     General: Abdomen is flat. Bowel sounds are normal.     Palpations: Abdomen is soft.  Musculoskeletal:        General: Swelling present.     Cervical back: Neck supple.  Skin:    General: Skin is warm.  Neurological:     General: No focal deficit present.     Mental Status: He is alert.  Psychiatric:        Mood and Affect: Mood normal.        Thought Content: Thought content normal.     Labs reviewed: Recent Labs    04/10/22 0000 10/23/22 0000 11/13/22 1319  NA 136* 137 135  K 4.9 4.6 4.4  CL 99 100 97*  CO2  27* 26* 31  GLUCOSE  --   --  121*  BUN 13 13 12   CREATININE 1.2 1.1 1.11  CALCIUM 10.0 9.6 9.9   Recent Labs    04/10/22 0000 10/23/22 0000 11/13/22 1319  AST 57* 46* 47*  ALT 40 32 31  ALKPHOS 118 112 88  BILITOT  --   --  1.6*  PROT  --   --  7.5  ALBUMIN 4.4 4.3 4.4   Recent Labs    10/23/22 0000 10/25/22 0000 11/13/22 1056  WBC 2.4  2.3 2.5*  NEUTROABS  --  1.20 1.5*  HGB 14.1 13.9 14.2  HCT 41  --  42.3  MCV  --   --  88.9  PLT 65* 68* 73*   Lab Results  Component Value Date   TSH 1.16 03/01/2022   No results found for: "HGBA1C" Lab Results  Component Value Date   CHOL 200 03/01/2022   HDL 85 (A) 03/01/2022   LDLCALC 96 03/01/2022   TRIG 95 03/01/2022    Significant Diagnostic Results in last 30 days:  No results found.  Assessment/Plan 1. 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 Essential hypertension Metoprolol  3. Benign prostatic hyperplasia without lower urinary tract symptoms Proscar  4. Thrombocytopenia (HCC) Due to Previous history of alcohol uses  5. Bilateral leg edema Will watch for now Coservative management  6. Primary insomnia High dose of Restoril per Dr Donell Beers Lewanda Rife    Family/ staff Communication: Hospice referral made  Labs/tests ordered:

## 2023-02-12 DIAGNOSIS — Z9181 History of falling: Secondary | ICD-10-CM | POA: Diagnosis not present

## 2023-02-12 DIAGNOSIS — R4189 Other symptoms and signs involving cognitive functions and awareness: Secondary | ICD-10-CM | POA: Diagnosis not present

## 2023-02-12 DIAGNOSIS — R278 Other lack of coordination: Secondary | ICD-10-CM | POA: Diagnosis not present

## 2023-02-12 DIAGNOSIS — M6389 Disorders of muscle in diseases classified elsewhere, multiple sites: Secondary | ICD-10-CM | POA: Diagnosis not present

## 2023-02-12 DIAGNOSIS — F02B0 Dementia in other diseases classified elsewhere, moderate, without behavioral disturbance, psychotic disturbance, mood disturbance, and anxiety: Secondary | ICD-10-CM | POA: Diagnosis not present

## 2023-02-13 ENCOUNTER — Inpatient Hospital Stay: Payer: Medicare Other

## 2023-02-13 ENCOUNTER — Inpatient Hospital Stay: Payer: Medicare Other | Admitting: Nurse Practitioner

## 2023-02-14 DIAGNOSIS — G309 Alzheimer's disease, unspecified: Secondary | ICD-10-CM | POA: Diagnosis not present

## 2023-02-14 DIAGNOSIS — I10 Essential (primary) hypertension: Secondary | ICD-10-CM | POA: Diagnosis not present

## 2023-02-14 DIAGNOSIS — E785 Hyperlipidemia, unspecified: Secondary | ICD-10-CM | POA: Diagnosis not present

## 2023-02-14 DIAGNOSIS — N400 Enlarged prostate without lower urinary tract symptoms: Secondary | ICD-10-CM | POA: Diagnosis not present

## 2023-02-14 DIAGNOSIS — C4492 Squamous cell carcinoma of skin, unspecified: Secondary | ICD-10-CM | POA: Diagnosis not present

## 2023-02-14 DIAGNOSIS — R634 Abnormal weight loss: Secondary | ICD-10-CM | POA: Diagnosis not present

## 2023-02-14 DIAGNOSIS — F0280 Dementia in other diseases classified elsewhere without behavioral disturbance: Secondary | ICD-10-CM | POA: Diagnosis not present

## 2023-02-14 DIAGNOSIS — K219 Gastro-esophageal reflux disease without esophagitis: Secondary | ICD-10-CM | POA: Diagnosis not present

## 2023-02-15 ENCOUNTER — Encounter: Payer: Self-pay | Admitting: Internal Medicine

## 2023-02-15 DIAGNOSIS — F0280 Dementia in other diseases classified elsewhere without behavioral disturbance: Secondary | ICD-10-CM | POA: Diagnosis not present

## 2023-02-15 DIAGNOSIS — R634 Abnormal weight loss: Secondary | ICD-10-CM | POA: Diagnosis not present

## 2023-02-15 DIAGNOSIS — I10 Essential (primary) hypertension: Secondary | ICD-10-CM | POA: Diagnosis not present

## 2023-02-15 DIAGNOSIS — E785 Hyperlipidemia, unspecified: Secondary | ICD-10-CM | POA: Diagnosis not present

## 2023-02-15 DIAGNOSIS — G309 Alzheimer's disease, unspecified: Secondary | ICD-10-CM | POA: Diagnosis not present

## 2023-02-15 DIAGNOSIS — N400 Enlarged prostate without lower urinary tract symptoms: Secondary | ICD-10-CM | POA: Diagnosis not present

## 2023-02-18 DIAGNOSIS — N400 Enlarged prostate without lower urinary tract symptoms: Secondary | ICD-10-CM | POA: Diagnosis not present

## 2023-02-18 DIAGNOSIS — I10 Essential (primary) hypertension: Secondary | ICD-10-CM | POA: Diagnosis not present

## 2023-02-18 DIAGNOSIS — G309 Alzheimer's disease, unspecified: Secondary | ICD-10-CM | POA: Diagnosis not present

## 2023-02-18 DIAGNOSIS — F0280 Dementia in other diseases classified elsewhere without behavioral disturbance: Secondary | ICD-10-CM | POA: Diagnosis not present

## 2023-02-18 DIAGNOSIS — E785 Hyperlipidemia, unspecified: Secondary | ICD-10-CM | POA: Diagnosis not present

## 2023-02-18 DIAGNOSIS — R634 Abnormal weight loss: Secondary | ICD-10-CM | POA: Diagnosis not present

## 2023-02-22 DIAGNOSIS — F332 Major depressive disorder, recurrent severe without psychotic features: Secondary | ICD-10-CM | POA: Diagnosis not present

## 2023-02-25 ENCOUNTER — Non-Acute Institutional Stay (SKILLED_NURSING_FACILITY): Payer: Medicare Other | Admitting: Adult Health

## 2023-02-25 ENCOUNTER — Encounter: Payer: Self-pay | Admitting: Adult Health

## 2023-02-25 DIAGNOSIS — H1033 Unspecified acute conjunctivitis, bilateral: Secondary | ICD-10-CM

## 2023-02-25 MED ORDER — ERYTHROMYCIN 5 MG/GM OP OINT
1.0000 | TOPICAL_OINTMENT | Freq: Four times a day (QID) | OPHTHALMIC | Status: AC
Start: 2023-02-25 — End: 2023-03-04

## 2023-02-25 NOTE — Progress Notes (Signed)
Location:  [facility] Room Number: 311A Place of Service:  SNF [phone]) Provider: Tamsen Roers, MD  Patient Care Team: Mahlon Gammon, MD as PCP - General (Internal Medicine) Sinda Du, MD as Consulting Physician (Ophthalmology) Raeanne Barry, DMD (Dentistry)  Extended Emergency Contact Information Primary Emergency Contact: o'brien,mary Mobile Phone: [phone] Relation: Spouse Preferred language: English Secondary Emergency Contact: ROBINSON,JULIA Mobile Phone: [phone] Relation: Daughter Interpreter needed? No  Code Status:  DNR Hospice Goals of care: Advanced Directive information    02/25/2023   10:06 AM  Advanced Directives  Does Patient Have a Medical Advance Directive? Yes  Type of Advance Directive Out of facility DNR (pink MOST or yellow form)  Does patient want to make changes to medical advance directive? No - Patient declined  Copy of Healthcare Power of Attorney in Chart? No - copy requested  Pre-existing out of facility DNR order (yellow form or pink MOST form) Pink MOST form placed in chart (order not valid for inpatient use)     Chief Complaint  Patient presents with   Acute Visit    Patient is being seen for eye drainage    Immunizations    Patient is due for covid and flu vaccine     HPI:  Pt is a 88 y.o. male seen today for an acute visit for eye drainage  Nurse reports purulent eye drainage and redness to both eyes. Pt has dementia and can not provide a hx . Has had this problem before and responded to erythromycin.    Past Medical History:  Diagnosis Date   BPH (benign prostatic hyperplasia)    Chronic kidney disease    Dementia (HCC)    GERD (gastroesophageal reflux disease)    Hyperlipidemia    Hypertension    Past Surgical History:  Procedure Laterality Date   TRANSURETHRAL RESECTION OF PROSTATE      No Known Allergies  Outpatient Encounter Medications as of  02/25/2023  Medication Sig   B Complex Vitamins (VITAMIN B COMPLEX) TABS Take 1 tablet by mouth daily.   brexpiprazole (REXULTI) 2 MG TABS tablet Take 2 mg by mouth at bedtime.   haloperidol (HALDOL) 2 MG tablet Take 2 mg by mouth every 8 (eight) hours as needed.   haloperidol (HALDOL) 2 MG tablet Take 2 mg by mouth daily. At 4 pm   memantine (NAMENDA) 10 MG tablet Take 10 mg by mouth 2 (two) times daily.   Metoprolol Succinate 25 MG CS24 Take one tablet by mouth once daily.   Multiple Vitamin (MULTIVITAMIN) tablet Take 1 tablet by mouth daily.   pantoprazole (PROTONIX) 40 MG tablet TAKE 1 TABLET EVERY DAY   rivastigmine (EXELON) 13.3 MG/24HR Place 1 patch (13.3 mg total) onto the skin daily.   tamsulosin (FLOMAX) 0.4 MG CAPS capsule Take 1 capsule (0.4 mg total) by mouth daily.   temazepam (RESTORIL) 15 MG capsule Take 2 capsules by mouth at bedtime as needed for sleep.   Suvorexant (BELSOMRA) 10 MG TABS Take 10 mg by mouth daily in the afternoon. (Patient not taking: Reported on 02/25/2023)   No facility-administered encounter medications on file as of 02/25/2023.    Review of Systems  Unable to perform ROS: Dementia    Immunization History  Administered Date(s) Administered   Fluad Quad(high Dose 65+) 04/10/2022   Influenza-Unspecified 05/06/2020   Moderna Covid-19 Vaccine Bivalent Booster 36yrs & up 11/02/2022   Moderna SARS-COV2 Booster Vaccination 11/04/2020, 12/11/2021   Moderna  Sars-Covid-2 Vaccination 07/21/2019, 08/18/2019, 05/25/2020, 04/21/2021   PNEUMOCOCCAL CONJUGATE-20 05/02/2022   Pneumococcal-Unspecified 06/22/2014   Td 07/09/2010   Tdap 11/14/2022   Zoster Recombinant(Shingrix) 07/09/2010, 05/09/2018   Pertinent  Health Maintenance Due  Topic Date Due   INFLUENZA VACCINE  02/07/2023      04/24/2022    9:21 AM 10/29/2022    1:18 PM 11/19/2022    3:34 PM 02/01/2023   11:17 AM 02/11/2023   11:31 AM  Fall Risk  Falls in the past year? 0 0 0 1 1  Was there an  injury with Fall? 0 0 0 1 1  Fall Risk Category Calculator 0 0 0 3 3  Fall Risk Category (Retired) Low      (RETIRED) Patient Fall Risk Level Low fall risk      Patient at Risk for Falls Due to No Fall Risks No Fall Risks No Fall Risks History of fall(s) History of fall(s);Impaired balance/gait;Impaired mobility  Fall risk Follow up Falls evaluation completed  Falls evaluation completed Falls evaluation completed Falls evaluation completed   Functional Status Survey:    Vitals:   02/25/23 1003  BP: (!) 113/56  Pulse: (!) 51  Resp: 20  Temp: (!) 97.1 F (36.2 C)  TempSrc: Temporal  SpO2: 99%  Weight: 183 lb 4.8 oz (83.1 kg)  Height: 5\' 9"  (1.753 m)   Body mass index is 27.07 kg/m. Physical Exam Constitutional:      Appearance: Normal appearance.  HENT:     Head: Normocephalic and atraumatic.     Nose: Nose normal.  Eyes:     General:        Right eye: Discharge present.        Left eye: Discharge present.    Pupils: Pupils are equal, round, and reactive to light.     Comments: Erythema to conjunctiva and sclera  Neurological:     Mental Status: He is alert.     Labs reviewed: Recent Labs    04/10/22 0000 10/23/22 0000 11/13/22 1319  NA 136* 137 135  K 4.9 4.6 4.4  CL 99 100 97*  CO2 27* 26* 31  GLUCOSE  --   --  121*  BUN 13 13 12   CREATININE 1.2 1.1 1.11  CALCIUM 10.0 9.6 9.9   Recent Labs    04/10/22 0000 10/23/22 0000 11/13/22 1319  AST 57* 46* 47*  ALT 40 32 31  ALKPHOS 118 112 88  BILITOT  --   --  1.6*  PROT  --   --  7.5  ALBUMIN 4.4 4.3 4.4   Recent Labs    10/23/22 0000 10/25/22 0000 11/13/22 1056  WBC 2.4 2.3 2.5*  NEUTROABS  --  1.20 1.5*  HGB 14.1 13.9 14.2  HCT 41  --  42.3  MCV  --   --  88.9  PLT 65* 68* 73*   Lab Results  Component Value Date   TSH 1.16 03/01/2022   No results found for: "HGBA1C" Lab Results  Component Value Date   CHOL 200 03/01/2022   HDL 85 (A) 03/01/2022   LDLCALC 96 03/01/2022   TRIG 95  03/01/2022    Significant Diagnostic Results in last 30 days:  No results found.  Assessment/Plan  1. Acute bacterial conjunctivitis of both eyes  - erythromycin ophthalmic ointment; Place 1 Application into both eyes 4 (four) times daily for 7 days.  Family/ staff Communication: nurse  Labs/tests ordered:  NA

## 2023-02-26 ENCOUNTER — Encounter: Payer: Medicare Other | Admitting: Internal Medicine

## 2023-02-26 DIAGNOSIS — G309 Alzheimer's disease, unspecified: Secondary | ICD-10-CM | POA: Diagnosis not present

## 2023-02-26 DIAGNOSIS — R634 Abnormal weight loss: Secondary | ICD-10-CM | POA: Diagnosis not present

## 2023-02-26 DIAGNOSIS — F0280 Dementia in other diseases classified elsewhere without behavioral disturbance: Secondary | ICD-10-CM | POA: Diagnosis not present

## 2023-02-26 DIAGNOSIS — N400 Enlarged prostate without lower urinary tract symptoms: Secondary | ICD-10-CM | POA: Diagnosis not present

## 2023-02-26 DIAGNOSIS — E785 Hyperlipidemia, unspecified: Secondary | ICD-10-CM | POA: Diagnosis not present

## 2023-02-26 DIAGNOSIS — I10 Essential (primary) hypertension: Secondary | ICD-10-CM | POA: Diagnosis not present

## 2023-03-07 ENCOUNTER — Other Ambulatory Visit: Payer: Self-pay | Admitting: Adult Health

## 2023-03-07 MED ORDER — TEMAZEPAM 30 MG PO CAPS: 30.0000 mg | ORAL_CAPSULE | Freq: Every day | ORAL | 3 refills | Status: DC

## 2023-03-10 DIAGNOSIS — I10 Essential (primary) hypertension: Secondary | ICD-10-CM | POA: Diagnosis not present

## 2023-03-10 DIAGNOSIS — R634 Abnormal weight loss: Secondary | ICD-10-CM | POA: Diagnosis not present

## 2023-03-10 DIAGNOSIS — F0280 Dementia in other diseases classified elsewhere without behavioral disturbance: Secondary | ICD-10-CM | POA: Diagnosis not present

## 2023-03-10 DIAGNOSIS — E785 Hyperlipidemia, unspecified: Secondary | ICD-10-CM | POA: Diagnosis not present

## 2023-03-10 DIAGNOSIS — N400 Enlarged prostate without lower urinary tract symptoms: Secondary | ICD-10-CM | POA: Diagnosis not present

## 2023-03-10 DIAGNOSIS — G309 Alzheimer's disease, unspecified: Secondary | ICD-10-CM | POA: Diagnosis not present

## 2023-03-10 DIAGNOSIS — C4492 Squamous cell carcinoma of skin, unspecified: Secondary | ICD-10-CM | POA: Diagnosis not present

## 2023-03-10 DIAGNOSIS — K219 Gastro-esophageal reflux disease without esophagitis: Secondary | ICD-10-CM | POA: Diagnosis not present

## 2023-03-11 DIAGNOSIS — I10 Essential (primary) hypertension: Secondary | ICD-10-CM | POA: Diagnosis not present

## 2023-03-11 DIAGNOSIS — E785 Hyperlipidemia, unspecified: Secondary | ICD-10-CM | POA: Diagnosis not present

## 2023-03-11 DIAGNOSIS — R634 Abnormal weight loss: Secondary | ICD-10-CM | POA: Diagnosis not present

## 2023-03-11 DIAGNOSIS — G309 Alzheimer's disease, unspecified: Secondary | ICD-10-CM | POA: Diagnosis not present

## 2023-03-11 DIAGNOSIS — N400 Enlarged prostate without lower urinary tract symptoms: Secondary | ICD-10-CM | POA: Diagnosis not present

## 2023-03-11 DIAGNOSIS — F0280 Dementia in other diseases classified elsewhere without behavioral disturbance: Secondary | ICD-10-CM | POA: Diagnosis not present

## 2023-03-12 DIAGNOSIS — R634 Abnormal weight loss: Secondary | ICD-10-CM | POA: Diagnosis not present

## 2023-03-12 DIAGNOSIS — I10 Essential (primary) hypertension: Secondary | ICD-10-CM | POA: Diagnosis not present

## 2023-03-12 DIAGNOSIS — N400 Enlarged prostate without lower urinary tract symptoms: Secondary | ICD-10-CM | POA: Diagnosis not present

## 2023-03-12 DIAGNOSIS — E785 Hyperlipidemia, unspecified: Secondary | ICD-10-CM | POA: Diagnosis not present

## 2023-03-12 DIAGNOSIS — G309 Alzheimer's disease, unspecified: Secondary | ICD-10-CM | POA: Diagnosis not present

## 2023-03-12 DIAGNOSIS — F0280 Dementia in other diseases classified elsewhere without behavioral disturbance: Secondary | ICD-10-CM | POA: Diagnosis not present

## 2023-03-13 DIAGNOSIS — E785 Hyperlipidemia, unspecified: Secondary | ICD-10-CM | POA: Diagnosis not present

## 2023-03-13 DIAGNOSIS — F0280 Dementia in other diseases classified elsewhere without behavioral disturbance: Secondary | ICD-10-CM | POA: Diagnosis not present

## 2023-03-13 DIAGNOSIS — N400 Enlarged prostate without lower urinary tract symptoms: Secondary | ICD-10-CM | POA: Diagnosis not present

## 2023-03-13 DIAGNOSIS — I10 Essential (primary) hypertension: Secondary | ICD-10-CM | POA: Diagnosis not present

## 2023-03-13 DIAGNOSIS — G309 Alzheimer's disease, unspecified: Secondary | ICD-10-CM | POA: Diagnosis not present

## 2023-03-13 DIAGNOSIS — R634 Abnormal weight loss: Secondary | ICD-10-CM | POA: Diagnosis not present

## 2023-03-14 DIAGNOSIS — E785 Hyperlipidemia, unspecified: Secondary | ICD-10-CM | POA: Diagnosis not present

## 2023-03-14 DIAGNOSIS — I10 Essential (primary) hypertension: Secondary | ICD-10-CM | POA: Diagnosis not present

## 2023-03-14 DIAGNOSIS — R634 Abnormal weight loss: Secondary | ICD-10-CM | POA: Diagnosis not present

## 2023-03-14 DIAGNOSIS — F0280 Dementia in other diseases classified elsewhere without behavioral disturbance: Secondary | ICD-10-CM | POA: Diagnosis not present

## 2023-03-14 DIAGNOSIS — N400 Enlarged prostate without lower urinary tract symptoms: Secondary | ICD-10-CM | POA: Diagnosis not present

## 2023-03-14 DIAGNOSIS — G309 Alzheimer's disease, unspecified: Secondary | ICD-10-CM | POA: Diagnosis not present

## 2023-03-15 ENCOUNTER — Non-Acute Institutional Stay (SKILLED_NURSING_FACILITY): Payer: Medicare Other | Admitting: Adult Health

## 2023-03-15 ENCOUNTER — Encounter: Payer: Self-pay | Admitting: Adult Health

## 2023-03-15 DIAGNOSIS — I10 Essential (primary) hypertension: Secondary | ICD-10-CM

## 2023-03-15 DIAGNOSIS — N400 Enlarged prostate without lower urinary tract symptoms: Secondary | ICD-10-CM

## 2023-03-15 DIAGNOSIS — G301 Alzheimer's disease with late onset: Secondary | ICD-10-CM | POA: Diagnosis not present

## 2023-03-15 DIAGNOSIS — D696 Thrombocytopenia, unspecified: Secondary | ICD-10-CM | POA: Diagnosis not present

## 2023-03-15 DIAGNOSIS — G309 Alzheimer's disease, unspecified: Secondary | ICD-10-CM | POA: Diagnosis not present

## 2023-03-15 DIAGNOSIS — F0280 Dementia in other diseases classified elsewhere without behavioral disturbance: Secondary | ICD-10-CM | POA: Diagnosis not present

## 2023-03-15 DIAGNOSIS — E785 Hyperlipidemia, unspecified: Secondary | ICD-10-CM | POA: Diagnosis not present

## 2023-03-15 DIAGNOSIS — H1031 Unspecified acute conjunctivitis, right eye: Secondary | ICD-10-CM

## 2023-03-15 DIAGNOSIS — F332 Major depressive disorder, recurrent severe without psychotic features: Secondary | ICD-10-CM | POA: Diagnosis not present

## 2023-03-15 DIAGNOSIS — F02C11 Dementia in other diseases classified elsewhere, severe, with agitation: Secondary | ICD-10-CM

## 2023-03-15 DIAGNOSIS — R634 Abnormal weight loss: Secondary | ICD-10-CM | POA: Diagnosis not present

## 2023-03-15 MED ORDER — HYPROMELLOSE (GONIOSCOPIC) 2.5 % OP SOLN: 1.0000 [drp] | Freq: Two times a day (BID) | OPHTHALMIC | Status: DC

## 2023-03-15 NOTE — Progress Notes (Signed)
Location:   Medical illustrator of Service:  SNF (31) Provider:  Lenoria Chime, NP  Mahlon Gammon, MD  Patient Care Team: Mahlon Gammon, MD as PCP - General (Internal Medicine) Sinda Du, MD as Consulting Physician (Ophthalmology) Raeanne Barry, DMD (Dentistry)  Extended Emergency Contact Information Primary Emergency Contact: o'brien,mary Mobile Phone: [phone] Relation: Spouse Preferred language: English Secondary Emergency Contact: ROBINSON,JULIA Mobile Phone: [phone] Relation: Daughter Interpreter needed? No  Code Status:  DNR Goals of care: Advanced Directive information    02/25/2023   10:06 AM  Advanced Directives  Does Patient Have a Medical Advance Directive? Yes  Type of Advance Directive Out of facility DNR (pink MOST or yellow form)  Does patient want to make changes to medical advance directive? No - Patient declined  Copy of Healthcare Power of Attorney in Chart? No - copy requested  Pre-existing out of facility DNR order (yellow form or pink MOST form) Pink MOST form placed in chart (order not valid for inpatient use)     Chief Complaint  Patient presents with   Medical Management of Chronic Issues    HPI:  Pt is a 88 y.o. male seen today for medical management of chronic diseases.   Followed by hospice, lives in the memory care unit.  Dementia diagnosed as MCI in 2008 at [facility] progressed over time now in memory care at [facility]. Marland Kitchen He was seen by Dr Donell Beers for aggression and resistance to care. The nursing staff report his behaviors are better. He is sleeping a lot during the day but arouses easily. Haldol reduced by psych today to 1mg  in the afternoon.  Sleeping at night improved on restoril  He has a hx low white count and low platelets. He was referred to hematology and seen by Dr Truett Perna on 11/13/22.  He was not felt to be a candidate for bone marrow bx. 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  He has no current symptoms  HTN controlled  BPH: has incontinence and wears brief. No issues with retention or UTI for this visit. Hx of TURP. He is off flomax, no reported issues.   He is finishing treatment for conjunctivitis with gentamicin. Improved but still has some mild redness.   Past Medical History:  Diagnosis Date   BPH (benign prostatic hyperplasia)    Chronic kidney disease    Dementia (HCC)    GERD (gastroesophageal reflux disease)    Hyperlipidemia    Hypertension    Past Surgical History:  Procedure Laterality Date   TRANSURETHRAL RESECTION OF PROSTATE      No Known Allergies  Allergies as of 03/15/2023   No Known Allergies      Medication List        Accurate as of March 15, 2023 12:40 PM. If you have any questions, ask your nurse or doctor.          STOP taking these medications    tamsulosin 0.4 MG Caps capsule Commonly known as: FLOMAX Stopped by: Fletcher Anon       TAKE these medications    brexpiprazole 2 MG Tabs tablet Commonly known as: REXULTI Take 2 mg by mouth at bedtime.   haloperidol 2 MG tablet Commonly known as: HALDOL Take 1 mg by mouth daily. At 4 pm What changed: Another medication with the same name was removed. Continue taking this medication, and follow the directions you see here. Changed by: Fletcher Anon  memantine 10 MG tablet Commonly known as: NAMENDA Take 10 mg by mouth 2 (two) times daily.   Metoprolol Succinate 25 MG Cs24 Take one tablet by mouth once daily.   multivitamin tablet Take 1 tablet by mouth daily.   pantoprazole 40 MG tablet Commonly known as: PROTONIX TAKE 1 TABLET EVERY DAY   rivastigmine 13.3 MG/24HR Commonly known as: EXELON Place 1 patch (13.3 mg total) onto the skin daily.   temazepam 30 MG capsule Commonly known as: RESTORIL Take 1 capsule (30 mg total) by mouth at bedtime.   Vitamin B Complex Tabs Take 1 tablet by mouth  daily.        Review of Systems  Unable to perform ROS: Dementia    Immunization History  Administered Date(s) Administered   Fluad Quad(high Dose 65+) 04/10/2022   Influenza-Unspecified 05/06/2020   Moderna Covid-19 Vaccine Bivalent Booster 42yrs & up 11/02/2022   Moderna SARS-COV2 Booster Vaccination 11/04/2020, 12/11/2021   Moderna Sars-Covid-2 Vaccination 07/21/2019, 08/18/2019, 05/25/2020, 04/21/2021   PNEUMOCOCCAL CONJUGATE-20 05/02/2022   Pneumococcal-Unspecified 06/22/2014   Td 07/09/2010   Tdap 11/14/2022   Zoster Recombinant(Shingrix) 07/09/2010, 05/09/2018   Pertinent  Health Maintenance Due  Topic Date Due   INFLUENZA VACCINE  02/07/2023      04/24/2022    9:21 AM 10/29/2022    1:18 PM 11/19/2022    3:34 PM 02/01/2023   11:17 AM 02/11/2023   11:31 AM  Fall Risk  Falls in the past year? 0 0 0 1 1  Was there an injury with Fall? 0 0 0 1 1  Fall Risk Category Calculator 0 0 0 3 3  Fall Risk Category (Retired) Low      (RETIRED) Patient Fall Risk Level Low fall risk      Patient at Risk for Falls Due to No Fall Risks No Fall Risks No Fall Risks History of fall(s) History of fall(s);Impaired balance/gait;Impaired mobility  Fall risk Follow up Falls evaluation completed  Falls evaluation completed Falls evaluation completed Falls evaluation completed   Functional Status Survey:    Vitals:   03/15/23 1156  BP: 115/70  Pulse: 60  Resp: 20  Temp: 98.2 F (36.8 C)  SpO2: 98%  Weight: 185 lb 3.2 oz (84 kg)   Body mass index is 27.35 kg/m. Physical Exam Vitals and nursing note reviewed.  Constitutional:      General: He is not in acute distress.    Appearance: He is not diaphoretic.  HENT:     Head: Normocephalic and atraumatic.  Neck:     Thyroid: No thyromegaly.     Vascular: No JVD.     Trachea: No tracheal deviation.  Cardiovascular:     Rate and Rhythm: Normal rate and regular rhythm.     Heart sounds: No murmur heard. Pulmonary:     Effort:  Pulmonary effort is normal. No respiratory distress.     Breath sounds: Normal breath sounds. No wheezing.  Abdominal:     General: Bowel sounds are normal. There is no distension.     Palpations: Abdomen is soft.     Tenderness: There is no abdominal tenderness.  Musculoskeletal:     Right lower leg: No edema.     Left lower leg: No edema.  Lymphadenopathy:     Cervical: No cervical adenopathy.  Skin:    General: Skin is warm and dry.  Neurological:     General: No focal deficit present.     Mental Status: He is alert. Mental  status is at baseline.  Psychiatric:        Mood and Affect: Mood normal.     Labs reviewed: Recent Labs    04/10/22 0000 10/23/22 0000 11/13/22 1319  NA 136* 137 135  K 4.9 4.6 4.4  CL 99 100 97*  CO2 27* 26* 31  GLUCOSE  --   --  121*  BUN 13 13 12   CREATININE 1.2 1.1 1.11  CALCIUM 10.0 9.6 9.9   Recent Labs    04/10/22 0000 10/23/22 0000 11/13/22 1319  AST 57* 46* 47*  ALT 40 32 31  ALKPHOS 118 112 88  BILITOT  --   --  1.6*  PROT  --   --  7.5  ALBUMIN 4.4 4.3 4.4   Recent Labs    10/23/22 0000 10/25/22 0000 11/13/22 1056  WBC 2.4 2.3 2.5*  NEUTROABS  --  1.20 1.5*  HGB 14.1 13.9 14.2  HCT 41  --  42.3  MCV  --   --  88.9  PLT 65* 68* 73*   Lab Results  Component Value Date   TSH 1.16 03/01/2022   No results found for: "HGBA1C" Lab Results  Component Value Date   CHOL 200 03/01/2022   HDL 85 (A) 03/01/2022   LDLCALC 96 03/01/2022   TRIG 95 03/01/2022    Significant Diagnostic Results in last 30 days:  No results found.  Assessment/Plan  1. Acute bacterial conjunctivitis of right eye Resolving Add artifical tears bid  2. 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 Continue supportive care in the skilled environment. Behaviors improved Followed by psych  3. Benign prostatic hyperplasia without lower urinary tract  symptoms Off flomax, no reported issues.  4. Essential hypertension Controlled Continue metoprolol  5. Thrombocytopenia (HCC) Plt 73K WBC 2.5 Has seen oncology No current issues Now on hospice     Labs/tests ordered:  NA

## 2023-03-16 DIAGNOSIS — F0280 Dementia in other diseases classified elsewhere without behavioral disturbance: Secondary | ICD-10-CM | POA: Diagnosis not present

## 2023-03-16 DIAGNOSIS — N400 Enlarged prostate without lower urinary tract symptoms: Secondary | ICD-10-CM | POA: Diagnosis not present

## 2023-03-16 DIAGNOSIS — G309 Alzheimer's disease, unspecified: Secondary | ICD-10-CM | POA: Diagnosis not present

## 2023-03-16 DIAGNOSIS — E785 Hyperlipidemia, unspecified: Secondary | ICD-10-CM | POA: Diagnosis not present

## 2023-03-16 DIAGNOSIS — I10 Essential (primary) hypertension: Secondary | ICD-10-CM | POA: Diagnosis not present

## 2023-03-16 DIAGNOSIS — R634 Abnormal weight loss: Secondary | ICD-10-CM | POA: Diagnosis not present

## 2023-03-17 DIAGNOSIS — E785 Hyperlipidemia, unspecified: Secondary | ICD-10-CM | POA: Diagnosis not present

## 2023-03-17 DIAGNOSIS — I10 Essential (primary) hypertension: Secondary | ICD-10-CM | POA: Diagnosis not present

## 2023-03-17 DIAGNOSIS — N400 Enlarged prostate without lower urinary tract symptoms: Secondary | ICD-10-CM | POA: Diagnosis not present

## 2023-03-17 DIAGNOSIS — R634 Abnormal weight loss: Secondary | ICD-10-CM | POA: Diagnosis not present

## 2023-03-17 DIAGNOSIS — G309 Alzheimer's disease, unspecified: Secondary | ICD-10-CM | POA: Diagnosis not present

## 2023-03-17 DIAGNOSIS — F0280 Dementia in other diseases classified elsewhere without behavioral disturbance: Secondary | ICD-10-CM | POA: Diagnosis not present

## 2023-03-18 DIAGNOSIS — G309 Alzheimer's disease, unspecified: Secondary | ICD-10-CM | POA: Diagnosis not present

## 2023-03-18 DIAGNOSIS — R634 Abnormal weight loss: Secondary | ICD-10-CM | POA: Diagnosis not present

## 2023-03-18 DIAGNOSIS — F0280 Dementia in other diseases classified elsewhere without behavioral disturbance: Secondary | ICD-10-CM | POA: Diagnosis not present

## 2023-03-18 DIAGNOSIS — N400 Enlarged prostate without lower urinary tract symptoms: Secondary | ICD-10-CM | POA: Diagnosis not present

## 2023-03-18 DIAGNOSIS — E785 Hyperlipidemia, unspecified: Secondary | ICD-10-CM | POA: Diagnosis not present

## 2023-03-18 DIAGNOSIS — I10 Essential (primary) hypertension: Secondary | ICD-10-CM | POA: Diagnosis not present

## 2023-03-19 DIAGNOSIS — G309 Alzheimer's disease, unspecified: Secondary | ICD-10-CM | POA: Diagnosis not present

## 2023-03-19 DIAGNOSIS — E785 Hyperlipidemia, unspecified: Secondary | ICD-10-CM | POA: Diagnosis not present

## 2023-03-19 DIAGNOSIS — I10 Essential (primary) hypertension: Secondary | ICD-10-CM | POA: Diagnosis not present

## 2023-03-19 DIAGNOSIS — R634 Abnormal weight loss: Secondary | ICD-10-CM | POA: Diagnosis not present

## 2023-03-19 DIAGNOSIS — F0280 Dementia in other diseases classified elsewhere without behavioral disturbance: Secondary | ICD-10-CM | POA: Diagnosis not present

## 2023-03-19 DIAGNOSIS — N400 Enlarged prostate without lower urinary tract symptoms: Secondary | ICD-10-CM | POA: Diagnosis not present

## 2023-03-20 DIAGNOSIS — E785 Hyperlipidemia, unspecified: Secondary | ICD-10-CM | POA: Diagnosis not present

## 2023-03-20 DIAGNOSIS — N400 Enlarged prostate without lower urinary tract symptoms: Secondary | ICD-10-CM | POA: Diagnosis not present

## 2023-03-20 DIAGNOSIS — F0280 Dementia in other diseases classified elsewhere without behavioral disturbance: Secondary | ICD-10-CM | POA: Diagnosis not present

## 2023-03-20 DIAGNOSIS — G309 Alzheimer's disease, unspecified: Secondary | ICD-10-CM | POA: Diagnosis not present

## 2023-03-20 DIAGNOSIS — R634 Abnormal weight loss: Secondary | ICD-10-CM | POA: Diagnosis not present

## 2023-03-20 DIAGNOSIS — I10 Essential (primary) hypertension: Secondary | ICD-10-CM | POA: Diagnosis not present

## 2023-03-21 DIAGNOSIS — F0280 Dementia in other diseases classified elsewhere without behavioral disturbance: Secondary | ICD-10-CM | POA: Diagnosis not present

## 2023-03-21 DIAGNOSIS — E785 Hyperlipidemia, unspecified: Secondary | ICD-10-CM | POA: Diagnosis not present

## 2023-03-21 DIAGNOSIS — N400 Enlarged prostate without lower urinary tract symptoms: Secondary | ICD-10-CM | POA: Diagnosis not present

## 2023-03-21 DIAGNOSIS — G309 Alzheimer's disease, unspecified: Secondary | ICD-10-CM | POA: Diagnosis not present

## 2023-03-21 DIAGNOSIS — R634 Abnormal weight loss: Secondary | ICD-10-CM | POA: Diagnosis not present

## 2023-03-21 DIAGNOSIS — I10 Essential (primary) hypertension: Secondary | ICD-10-CM | POA: Diagnosis not present

## 2023-03-22 DIAGNOSIS — I10 Essential (primary) hypertension: Secondary | ICD-10-CM | POA: Diagnosis not present

## 2023-03-22 DIAGNOSIS — E785 Hyperlipidemia, unspecified: Secondary | ICD-10-CM | POA: Diagnosis not present

## 2023-03-22 DIAGNOSIS — R634 Abnormal weight loss: Secondary | ICD-10-CM | POA: Diagnosis not present

## 2023-03-22 DIAGNOSIS — F0280 Dementia in other diseases classified elsewhere without behavioral disturbance: Secondary | ICD-10-CM | POA: Diagnosis not present

## 2023-03-22 DIAGNOSIS — G309 Alzheimer's disease, unspecified: Secondary | ICD-10-CM | POA: Diagnosis not present

## 2023-03-22 DIAGNOSIS — N400 Enlarged prostate without lower urinary tract symptoms: Secondary | ICD-10-CM | POA: Diagnosis not present

## 2023-03-23 DIAGNOSIS — R634 Abnormal weight loss: Secondary | ICD-10-CM | POA: Diagnosis not present

## 2023-03-23 DIAGNOSIS — F0280 Dementia in other diseases classified elsewhere without behavioral disturbance: Secondary | ICD-10-CM | POA: Diagnosis not present

## 2023-03-23 DIAGNOSIS — N400 Enlarged prostate without lower urinary tract symptoms: Secondary | ICD-10-CM | POA: Diagnosis not present

## 2023-03-23 DIAGNOSIS — I10 Essential (primary) hypertension: Secondary | ICD-10-CM | POA: Diagnosis not present

## 2023-03-23 DIAGNOSIS — G309 Alzheimer's disease, unspecified: Secondary | ICD-10-CM | POA: Diagnosis not present

## 2023-03-23 DIAGNOSIS — E785 Hyperlipidemia, unspecified: Secondary | ICD-10-CM | POA: Diagnosis not present

## 2023-03-24 DIAGNOSIS — R634 Abnormal weight loss: Secondary | ICD-10-CM | POA: Diagnosis not present

## 2023-03-24 DIAGNOSIS — G309 Alzheimer's disease, unspecified: Secondary | ICD-10-CM | POA: Diagnosis not present

## 2023-03-24 DIAGNOSIS — N400 Enlarged prostate without lower urinary tract symptoms: Secondary | ICD-10-CM | POA: Diagnosis not present

## 2023-03-24 DIAGNOSIS — I10 Essential (primary) hypertension: Secondary | ICD-10-CM | POA: Diagnosis not present

## 2023-03-24 DIAGNOSIS — F0280 Dementia in other diseases classified elsewhere without behavioral disturbance: Secondary | ICD-10-CM | POA: Diagnosis not present

## 2023-03-24 DIAGNOSIS — E785 Hyperlipidemia, unspecified: Secondary | ICD-10-CM | POA: Diagnosis not present

## 2023-03-25 DIAGNOSIS — R634 Abnormal weight loss: Secondary | ICD-10-CM | POA: Diagnosis not present

## 2023-03-25 DIAGNOSIS — N400 Enlarged prostate without lower urinary tract symptoms: Secondary | ICD-10-CM | POA: Diagnosis not present

## 2023-03-25 DIAGNOSIS — E785 Hyperlipidemia, unspecified: Secondary | ICD-10-CM | POA: Diagnosis not present

## 2023-03-25 DIAGNOSIS — I10 Essential (primary) hypertension: Secondary | ICD-10-CM | POA: Diagnosis not present

## 2023-03-25 DIAGNOSIS — G309 Alzheimer's disease, unspecified: Secondary | ICD-10-CM | POA: Diagnosis not present

## 2023-03-25 DIAGNOSIS — F0280 Dementia in other diseases classified elsewhere without behavioral disturbance: Secondary | ICD-10-CM | POA: Diagnosis not present

## 2023-03-26 DIAGNOSIS — G309 Alzheimer's disease, unspecified: Secondary | ICD-10-CM | POA: Diagnosis not present

## 2023-03-26 DIAGNOSIS — E785 Hyperlipidemia, unspecified: Secondary | ICD-10-CM | POA: Diagnosis not present

## 2023-03-26 DIAGNOSIS — R634 Abnormal weight loss: Secondary | ICD-10-CM | POA: Diagnosis not present

## 2023-03-26 DIAGNOSIS — I10 Essential (primary) hypertension: Secondary | ICD-10-CM | POA: Diagnosis not present

## 2023-03-26 DIAGNOSIS — F0280 Dementia in other diseases classified elsewhere without behavioral disturbance: Secondary | ICD-10-CM | POA: Diagnosis not present

## 2023-03-26 DIAGNOSIS — N400 Enlarged prostate without lower urinary tract symptoms: Secondary | ICD-10-CM | POA: Diagnosis not present

## 2023-03-27 DIAGNOSIS — N400 Enlarged prostate without lower urinary tract symptoms: Secondary | ICD-10-CM | POA: Diagnosis not present

## 2023-03-27 DIAGNOSIS — I10 Essential (primary) hypertension: Secondary | ICD-10-CM | POA: Diagnosis not present

## 2023-03-27 DIAGNOSIS — G309 Alzheimer's disease, unspecified: Secondary | ICD-10-CM | POA: Diagnosis not present

## 2023-03-27 DIAGNOSIS — E785 Hyperlipidemia, unspecified: Secondary | ICD-10-CM | POA: Diagnosis not present

## 2023-03-27 DIAGNOSIS — R634 Abnormal weight loss: Secondary | ICD-10-CM | POA: Diagnosis not present

## 2023-03-27 DIAGNOSIS — F0280 Dementia in other diseases classified elsewhere without behavioral disturbance: Secondary | ICD-10-CM | POA: Diagnosis not present

## 2023-03-28 DIAGNOSIS — R634 Abnormal weight loss: Secondary | ICD-10-CM | POA: Diagnosis not present

## 2023-03-28 DIAGNOSIS — G309 Alzheimer's disease, unspecified: Secondary | ICD-10-CM | POA: Diagnosis not present

## 2023-03-28 DIAGNOSIS — N400 Enlarged prostate without lower urinary tract symptoms: Secondary | ICD-10-CM | POA: Diagnosis not present

## 2023-03-28 DIAGNOSIS — E785 Hyperlipidemia, unspecified: Secondary | ICD-10-CM | POA: Diagnosis not present

## 2023-03-28 DIAGNOSIS — I10 Essential (primary) hypertension: Secondary | ICD-10-CM | POA: Diagnosis not present

## 2023-03-28 DIAGNOSIS — F0280 Dementia in other diseases classified elsewhere without behavioral disturbance: Secondary | ICD-10-CM | POA: Diagnosis not present

## 2023-03-29 DIAGNOSIS — R634 Abnormal weight loss: Secondary | ICD-10-CM | POA: Diagnosis not present

## 2023-03-29 DIAGNOSIS — G309 Alzheimer's disease, unspecified: Secondary | ICD-10-CM | POA: Diagnosis not present

## 2023-03-29 DIAGNOSIS — F0280 Dementia in other diseases classified elsewhere without behavioral disturbance: Secondary | ICD-10-CM | POA: Diagnosis not present

## 2023-03-29 DIAGNOSIS — I10 Essential (primary) hypertension: Secondary | ICD-10-CM | POA: Diagnosis not present

## 2023-03-29 DIAGNOSIS — N400 Enlarged prostate without lower urinary tract symptoms: Secondary | ICD-10-CM | POA: Diagnosis not present

## 2023-03-29 DIAGNOSIS — E785 Hyperlipidemia, unspecified: Secondary | ICD-10-CM | POA: Diagnosis not present

## 2023-03-30 DIAGNOSIS — I10 Essential (primary) hypertension: Secondary | ICD-10-CM | POA: Diagnosis not present

## 2023-03-30 DIAGNOSIS — G309 Alzheimer's disease, unspecified: Secondary | ICD-10-CM | POA: Diagnosis not present

## 2023-03-30 DIAGNOSIS — N400 Enlarged prostate without lower urinary tract symptoms: Secondary | ICD-10-CM | POA: Diagnosis not present

## 2023-03-30 DIAGNOSIS — E785 Hyperlipidemia, unspecified: Secondary | ICD-10-CM | POA: Diagnosis not present

## 2023-03-30 DIAGNOSIS — F0280 Dementia in other diseases classified elsewhere without behavioral disturbance: Secondary | ICD-10-CM | POA: Diagnosis not present

## 2023-03-30 DIAGNOSIS — R634 Abnormal weight loss: Secondary | ICD-10-CM | POA: Diagnosis not present

## 2023-03-31 DIAGNOSIS — F0280 Dementia in other diseases classified elsewhere without behavioral disturbance: Secondary | ICD-10-CM | POA: Diagnosis not present

## 2023-03-31 DIAGNOSIS — I10 Essential (primary) hypertension: Secondary | ICD-10-CM | POA: Diagnosis not present

## 2023-03-31 DIAGNOSIS — E785 Hyperlipidemia, unspecified: Secondary | ICD-10-CM | POA: Diagnosis not present

## 2023-03-31 DIAGNOSIS — G309 Alzheimer's disease, unspecified: Secondary | ICD-10-CM | POA: Diagnosis not present

## 2023-03-31 DIAGNOSIS — N400 Enlarged prostate without lower urinary tract symptoms: Secondary | ICD-10-CM | POA: Diagnosis not present

## 2023-03-31 DIAGNOSIS — R634 Abnormal weight loss: Secondary | ICD-10-CM | POA: Diagnosis not present

## 2023-04-01 DIAGNOSIS — N400 Enlarged prostate without lower urinary tract symptoms: Secondary | ICD-10-CM | POA: Diagnosis not present

## 2023-04-01 DIAGNOSIS — R634 Abnormal weight loss: Secondary | ICD-10-CM | POA: Diagnosis not present

## 2023-04-01 DIAGNOSIS — G309 Alzheimer's disease, unspecified: Secondary | ICD-10-CM | POA: Diagnosis not present

## 2023-04-01 DIAGNOSIS — F0280 Dementia in other diseases classified elsewhere without behavioral disturbance: Secondary | ICD-10-CM | POA: Diagnosis not present

## 2023-04-01 DIAGNOSIS — I10 Essential (primary) hypertension: Secondary | ICD-10-CM | POA: Diagnosis not present

## 2023-04-01 DIAGNOSIS — E785 Hyperlipidemia, unspecified: Secondary | ICD-10-CM | POA: Diagnosis not present

## 2023-04-02 DIAGNOSIS — E785 Hyperlipidemia, unspecified: Secondary | ICD-10-CM | POA: Diagnosis not present

## 2023-04-02 DIAGNOSIS — R634 Abnormal weight loss: Secondary | ICD-10-CM | POA: Diagnosis not present

## 2023-04-02 DIAGNOSIS — I10 Essential (primary) hypertension: Secondary | ICD-10-CM | POA: Diagnosis not present

## 2023-04-02 DIAGNOSIS — F0280 Dementia in other diseases classified elsewhere without behavioral disturbance: Secondary | ICD-10-CM | POA: Diagnosis not present

## 2023-04-02 DIAGNOSIS — G309 Alzheimer's disease, unspecified: Secondary | ICD-10-CM | POA: Diagnosis not present

## 2023-04-02 DIAGNOSIS — N400 Enlarged prostate without lower urinary tract symptoms: Secondary | ICD-10-CM | POA: Diagnosis not present

## 2023-04-03 DIAGNOSIS — R634 Abnormal weight loss: Secondary | ICD-10-CM | POA: Diagnosis not present

## 2023-04-03 DIAGNOSIS — G309 Alzheimer's disease, unspecified: Secondary | ICD-10-CM | POA: Diagnosis not present

## 2023-04-03 DIAGNOSIS — F0280 Dementia in other diseases classified elsewhere without behavioral disturbance: Secondary | ICD-10-CM | POA: Diagnosis not present

## 2023-04-03 DIAGNOSIS — E785 Hyperlipidemia, unspecified: Secondary | ICD-10-CM | POA: Diagnosis not present

## 2023-04-03 DIAGNOSIS — N400 Enlarged prostate without lower urinary tract symptoms: Secondary | ICD-10-CM | POA: Diagnosis not present

## 2023-04-03 DIAGNOSIS — I10 Essential (primary) hypertension: Secondary | ICD-10-CM | POA: Diagnosis not present

## 2023-04-04 DIAGNOSIS — R634 Abnormal weight loss: Secondary | ICD-10-CM | POA: Diagnosis not present

## 2023-04-04 DIAGNOSIS — N400 Enlarged prostate without lower urinary tract symptoms: Secondary | ICD-10-CM | POA: Diagnosis not present

## 2023-04-04 DIAGNOSIS — I10 Essential (primary) hypertension: Secondary | ICD-10-CM | POA: Diagnosis not present

## 2023-04-04 DIAGNOSIS — F0280 Dementia in other diseases classified elsewhere without behavioral disturbance: Secondary | ICD-10-CM | POA: Diagnosis not present

## 2023-04-04 DIAGNOSIS — E785 Hyperlipidemia, unspecified: Secondary | ICD-10-CM | POA: Diagnosis not present

## 2023-04-04 DIAGNOSIS — G309 Alzheimer's disease, unspecified: Secondary | ICD-10-CM | POA: Diagnosis not present

## 2023-04-05 DIAGNOSIS — F0280 Dementia in other diseases classified elsewhere without behavioral disturbance: Secondary | ICD-10-CM | POA: Diagnosis not present

## 2023-04-05 DIAGNOSIS — N400 Enlarged prostate without lower urinary tract symptoms: Secondary | ICD-10-CM | POA: Diagnosis not present

## 2023-04-05 DIAGNOSIS — R634 Abnormal weight loss: Secondary | ICD-10-CM | POA: Diagnosis not present

## 2023-04-05 DIAGNOSIS — E785 Hyperlipidemia, unspecified: Secondary | ICD-10-CM | POA: Diagnosis not present

## 2023-04-05 DIAGNOSIS — I10 Essential (primary) hypertension: Secondary | ICD-10-CM | POA: Diagnosis not present

## 2023-04-05 DIAGNOSIS — G309 Alzheimer's disease, unspecified: Secondary | ICD-10-CM | POA: Diagnosis not present

## 2023-04-06 DIAGNOSIS — F0280 Dementia in other diseases classified elsewhere without behavioral disturbance: Secondary | ICD-10-CM | POA: Diagnosis not present

## 2023-04-06 DIAGNOSIS — E785 Hyperlipidemia, unspecified: Secondary | ICD-10-CM | POA: Diagnosis not present

## 2023-04-06 DIAGNOSIS — G309 Alzheimer's disease, unspecified: Secondary | ICD-10-CM | POA: Diagnosis not present

## 2023-04-06 DIAGNOSIS — I10 Essential (primary) hypertension: Secondary | ICD-10-CM | POA: Diagnosis not present

## 2023-04-06 DIAGNOSIS — R634 Abnormal weight loss: Secondary | ICD-10-CM | POA: Diagnosis not present

## 2023-04-06 DIAGNOSIS — N400 Enlarged prostate without lower urinary tract symptoms: Secondary | ICD-10-CM | POA: Diagnosis not present

## 2023-04-07 DIAGNOSIS — F0280 Dementia in other diseases classified elsewhere without behavioral disturbance: Secondary | ICD-10-CM | POA: Diagnosis not present

## 2023-04-07 DIAGNOSIS — E785 Hyperlipidemia, unspecified: Secondary | ICD-10-CM | POA: Diagnosis not present

## 2023-04-07 DIAGNOSIS — I10 Essential (primary) hypertension: Secondary | ICD-10-CM | POA: Diagnosis not present

## 2023-04-07 DIAGNOSIS — R634 Abnormal weight loss: Secondary | ICD-10-CM | POA: Diagnosis not present

## 2023-04-07 DIAGNOSIS — N400 Enlarged prostate without lower urinary tract symptoms: Secondary | ICD-10-CM | POA: Diagnosis not present

## 2023-04-07 DIAGNOSIS — G309 Alzheimer's disease, unspecified: Secondary | ICD-10-CM | POA: Diagnosis not present

## 2023-04-08 DIAGNOSIS — G309 Alzheimer's disease, unspecified: Secondary | ICD-10-CM | POA: Diagnosis not present

## 2023-04-08 DIAGNOSIS — R634 Abnormal weight loss: Secondary | ICD-10-CM | POA: Diagnosis not present

## 2023-04-08 DIAGNOSIS — N400 Enlarged prostate without lower urinary tract symptoms: Secondary | ICD-10-CM | POA: Diagnosis not present

## 2023-04-08 DIAGNOSIS — I10 Essential (primary) hypertension: Secondary | ICD-10-CM | POA: Diagnosis not present

## 2023-04-08 DIAGNOSIS — E785 Hyperlipidemia, unspecified: Secondary | ICD-10-CM | POA: Diagnosis not present

## 2023-04-08 DIAGNOSIS — F0280 Dementia in other diseases classified elsewhere without behavioral disturbance: Secondary | ICD-10-CM | POA: Diagnosis not present

## 2023-04-09 DIAGNOSIS — I10 Essential (primary) hypertension: Secondary | ICD-10-CM | POA: Diagnosis not present

## 2023-04-09 DIAGNOSIS — E785 Hyperlipidemia, unspecified: Secondary | ICD-10-CM | POA: Diagnosis not present

## 2023-04-09 DIAGNOSIS — F0280 Dementia in other diseases classified elsewhere without behavioral disturbance: Secondary | ICD-10-CM | POA: Diagnosis not present

## 2023-04-09 DIAGNOSIS — C4492 Squamous cell carcinoma of skin, unspecified: Secondary | ICD-10-CM | POA: Diagnosis not present

## 2023-04-09 DIAGNOSIS — K219 Gastro-esophageal reflux disease without esophagitis: Secondary | ICD-10-CM | POA: Diagnosis not present

## 2023-04-09 DIAGNOSIS — G309 Alzheimer's disease, unspecified: Secondary | ICD-10-CM | POA: Diagnosis not present

## 2023-04-09 DIAGNOSIS — R634 Abnormal weight loss: Secondary | ICD-10-CM | POA: Diagnosis not present

## 2023-04-09 DIAGNOSIS — N400 Enlarged prostate without lower urinary tract symptoms: Secondary | ICD-10-CM | POA: Diagnosis not present

## 2023-04-10 DIAGNOSIS — E785 Hyperlipidemia, unspecified: Secondary | ICD-10-CM | POA: Diagnosis not present

## 2023-04-10 DIAGNOSIS — N400 Enlarged prostate without lower urinary tract symptoms: Secondary | ICD-10-CM | POA: Diagnosis not present

## 2023-04-10 DIAGNOSIS — R634 Abnormal weight loss: Secondary | ICD-10-CM | POA: Diagnosis not present

## 2023-04-10 DIAGNOSIS — G309 Alzheimer's disease, unspecified: Secondary | ICD-10-CM | POA: Diagnosis not present

## 2023-04-10 DIAGNOSIS — I10 Essential (primary) hypertension: Secondary | ICD-10-CM | POA: Diagnosis not present

## 2023-04-10 DIAGNOSIS — F0280 Dementia in other diseases classified elsewhere without behavioral disturbance: Secondary | ICD-10-CM | POA: Diagnosis not present

## 2023-04-11 ENCOUNTER — Encounter: Payer: Self-pay | Admitting: Adult Health

## 2023-04-11 ENCOUNTER — Non-Acute Institutional Stay (SKILLED_NURSING_FACILITY): Payer: Medicare Other | Admitting: Adult Health

## 2023-04-11 DIAGNOSIS — F02C11 Dementia in other diseases classified elsewhere, severe, with agitation: Secondary | ICD-10-CM

## 2023-04-11 DIAGNOSIS — F0280 Dementia in other diseases classified elsewhere without behavioral disturbance: Secondary | ICD-10-CM | POA: Diagnosis not present

## 2023-04-11 DIAGNOSIS — G301 Alzheimer's disease with late onset: Secondary | ICD-10-CM

## 2023-04-11 DIAGNOSIS — E785 Hyperlipidemia, unspecified: Secondary | ICD-10-CM | POA: Diagnosis not present

## 2023-04-11 DIAGNOSIS — D696 Thrombocytopenia, unspecified: Secondary | ICD-10-CM

## 2023-04-11 DIAGNOSIS — I10 Essential (primary) hypertension: Secondary | ICD-10-CM

## 2023-04-11 DIAGNOSIS — R634 Abnormal weight loss: Secondary | ICD-10-CM | POA: Diagnosis not present

## 2023-04-11 DIAGNOSIS — H1033 Unspecified acute conjunctivitis, bilateral: Secondary | ICD-10-CM | POA: Diagnosis not present

## 2023-04-11 DIAGNOSIS — N400 Enlarged prostate without lower urinary tract symptoms: Secondary | ICD-10-CM

## 2023-04-11 DIAGNOSIS — K219 Gastro-esophageal reflux disease without esophagitis: Secondary | ICD-10-CM | POA: Diagnosis not present

## 2023-04-11 DIAGNOSIS — G309 Alzheimer's disease, unspecified: Secondary | ICD-10-CM | POA: Diagnosis not present

## 2023-04-11 NOTE — Progress Notes (Signed)
Location:   Medical illustrator of Service:  SNF (31) Provider:  Lenoria Chime, NP  Noah Gammon, MD  Patient Care Team: Noah Gammon, MD as PCP - General (Internal Medicine) Noah Du, MD as Consulting Physician (Ophthalmology) Noah Hughes, DMD (Dentistry)  Extended Emergency Contact Information Primary Emergency Contact: o'brien,mary Mobile Phone: [phone] Relation: Spouse Preferred language: English Secondary Emergency Contact: ROBINSON,JULIA Mobile Phone: [phone] Relation: Daughter Interpreter needed? No  Code Status:  DNR Goals of care: Advanced Directive information    02/25/2023   10:06 AM  Advanced Directives  Does Patient Have a Medical Advance Directive? Yes  Type of Advance Directive Out of facility DNR (pink MOST or yellow form)  Does patient want to make changes to medical advance directive? No - Patient declined  Copy of Healthcare Power of Attorney in Chart? No - copy requested  Pre-existing out of facility DNR order (yellow form or pink MOST form) Pink MOST form placed in chart (order not valid for inpatient use)     Chief Complaint  Patient presents with   Medical Management of Chronic Issues    HPI:  Pt is a 88 y.o. male seen today for medical management of chronic diseases.   Followed by hospice, lives in the memory care unit.  Dementia diagnosed as MCI in 2008 at [facility] progressed over time now in memory care at [facility]. Noah Hughes He was seen by Noah Noah Hughes for aggression and resistance to care. The nursing staff report his behaviors are better. Doing well on reduced dose Haldol. Also on Rexulti. No acute behavior concerns   Had had several falls in the past month due to unsteady gait and getting up without help.  No significant injuries.  Does have bruising to both arms. Geri sleeves ordered but he tries to remove them.  Has recurrent conjunctivitis.   He has a hx low white count and low platelets. He was  referred to hematology and seen by Noah Hughes on 11/13/22.  He was not felt to be a candidate for bone marrow bx. The differential includes myelodysplasia and cirrhosis due to heavy alcohol use.   GERD: on protonix no reports of indigestion.   Insomnia: on Restoril. No reports of sleep issues.   Continues to fall frequently, no significant injury  HTN controlled  BPH: has incontinence and wears brief. No issues with retention or UTI for this visit. Hx of TURP. He is off flomax, no reported issues.     Past Medical History:  Diagnosis Date   BPH (benign prostatic hyperplasia)    Chronic kidney disease    Dementia (HCC)    GERD (gastroesophageal reflux disease)    Hyperlipidemia    Hypertension    Past Surgical History:  Procedure Laterality Date   TRANSURETHRAL RESECTION OF PROSTATE      No Known Allergies  Allergies as of 04/11/2023   No Known Allergies      Medication List        Accurate as of April 11, 2023 12:08 PM. If you have any questions, ask your nurse or doctor.          brexpiprazole 2 MG Tabs tablet Commonly known as: REXULTI Take 2 mg by mouth at bedtime.   haloperidol 2 MG tablet Commonly known as: HALDOL Take 1 mg by mouth daily. At 4 pm   hydroxypropyl methylcellulose / hypromellose 2.5 % ophthalmic solution Commonly known as: ISOPTO TEARS / GONIOVISC Place 1 drop into both eyes  2 (two) times daily.   memantine 10 MG tablet Commonly known as: NAMENDA Take 10 mg by mouth 2 (two) times daily.   Metoprolol Succinate 25 MG Cs24 Take one tablet by mouth once daily.   multivitamin tablet Take 1 tablet by mouth daily.   pantoprazole 40 MG tablet Commonly known as: PROTONIX TAKE 1 TABLET EVERY DAY   rivastigmine 13.3 MG/24HR Commonly known as: EXELON Place 1 patch (13.3 mg total) onto the skin daily.   temazepam 30 MG capsule Commonly known as: RESTORIL Take 1 capsule (30 mg total) by mouth at bedtime.   Vitamin B Complex  Tabs Take 1 tablet by mouth daily.        Review of Systems  Unable to perform ROS: Dementia    Immunization History  Administered Date(s) Administered   Fluad Quad(high Dose 65+) 04/10/2022   Influenza-Unspecified 05/06/2020   Moderna Covid-19 Vaccine Bivalent Booster 56yrs & up 11/02/2022   Moderna SARS-COV2 Booster Vaccination 11/04/2020, 12/11/2021   Moderna Sars-Covid-2 Vaccination 07/21/2019, 08/18/2019, 05/25/2020, 04/21/2021   PNEUMOCOCCAL CONJUGATE-20 05/02/2022   Pneumococcal-Unspecified 06/22/2014   Td 07/09/2010   Tdap 11/14/2022   Zoster Recombinant(Shingrix) 07/09/2010, 05/09/2018   Pertinent  Health Maintenance Due  Topic Date Due   INFLUENZA VACCINE  02/07/2023      04/24/2022    9:21 AM 10/29/2022    1:18 PM 11/19/2022    3:34 PM 02/01/2023   11:17 AM 02/11/2023   11:31 AM  Fall Risk  Falls in the past year? 0 0 0 1 1  Was there an injury with Fall? 0 0 0 1 1  Fall Risk Category Calculator 0 0 0 3 3  Fall Risk Category (Retired) Low      (RETIRED) Patient Fall Risk Level Low fall risk      Patient at Risk for Falls Due to No Fall Risks No Fall Risks No Fall Risks History of fall(s) History of fall(s);Impaired balance/gait;Impaired mobility  Fall risk Follow up Falls evaluation completed  Falls evaluation completed Falls evaluation completed Falls evaluation completed   Functional Status Survey:    Vitals:   04/11/23 1208  BP: 110/70  Pulse: 62  Resp: 16  Temp: (!) 97.2 F (36.2 C)  SpO2: 97%    There is no height or weight on file to calculate BMI. Physical Exam Vitals and nursing note reviewed.  Constitutional:      General: He is not in acute distress.    Appearance: He is not diaphoretic.  HENT:     Head: Normocephalic and atraumatic.  Eyes:     General:        Right eye: Discharge present.        Left eye: Discharge present.    Pupils: Pupils are equal, round, and reactive to light.     Comments: Purulent drainage to both eyes No  lid swelling or redness.   Neck:     Thyroid: No thyromegaly.     Vascular: No JVD.     Trachea: No tracheal deviation.  Cardiovascular:     Rate and Rhythm: Normal rate and regular rhythm.     Heart sounds: No murmur heard. Pulmonary:     Effort: Pulmonary effort is normal. No respiratory distress.     Breath sounds: Normal breath sounds. No wheezing.  Abdominal:     General: Bowel sounds are normal. There is no distension.     Palpations: Abdomen is soft.     Tenderness: There is no abdominal tenderness.  Musculoskeletal:     Right lower leg: No edema.     Left lower leg: No edema.  Lymphadenopathy:     Cervical: No cervical adenopathy.  Skin:    General: Skin is warm and dry.  Neurological:     General: No focal deficit present.     Mental Status: He is alert. Mental status is at baseline.  Psychiatric:        Mood and Affect: Mood normal.     Labs reviewed: Recent Labs    10/23/22 0000 11/13/22 1319  NA 137 135  K 4.6 4.4  CL 100 97*  CO2 26* 31  GLUCOSE  --  121*  BUN 13 12  CREATININE 1.1 1.11  CALCIUM 9.6 9.9   Recent Labs    10/23/22 0000 11/13/22 1319  AST 46* 47*  ALT 32 31  ALKPHOS 112 88  BILITOT  --  1.6*  PROT  --  7.5  ALBUMIN 4.3 4.4   Recent Labs    10/23/22 0000 10/25/22 0000 11/13/22 1056  WBC 2.4 2.3 2.5*  NEUTROABS  --  1.20 1.5*  HGB 14.1 13.9 14.2  HCT 41  --  42.3  MCV  --   --  88.9  PLT 65* 68* 73*   Lab Results  Component Value Date   TSH 1.16 03/01/2022   No results found for: "HGBA1C" Lab Results  Component Value Date   CHOL 200 03/01/2022   HDL 85 (A) 03/01/2022   LDLCALC 96 03/01/2022   TRIG 95 03/01/2022    Significant Diagnostic Results in last 30 days:  No results found.  Assessment/Plan  1. Acute bacterial conjunctivitis of right eye Resume gentamicin QID for 10 days  Then artificial tears bid  2. 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 Continue supportive care in the skilled environment. Behaviors improved Followed by psych  3. Benign prostatic hyperplasia without lower urinary tract symptoms Off flomax, no reported issues.  4. Essential hypertension Controlled Continue metoprolol  5. Thrombocytopenia (HCC) Plt 73K WBC 2.5 Has seen oncology No current issues Now on hospice  6. GERD Continue Protonix     Labs/tests ordered:  NA

## 2023-04-12 DIAGNOSIS — F0280 Dementia in other diseases classified elsewhere without behavioral disturbance: Secondary | ICD-10-CM | POA: Diagnosis not present

## 2023-04-12 DIAGNOSIS — I10 Essential (primary) hypertension: Secondary | ICD-10-CM | POA: Diagnosis not present

## 2023-04-12 DIAGNOSIS — N400 Enlarged prostate without lower urinary tract symptoms: Secondary | ICD-10-CM | POA: Diagnosis not present

## 2023-04-12 DIAGNOSIS — G309 Alzheimer's disease, unspecified: Secondary | ICD-10-CM | POA: Diagnosis not present

## 2023-04-12 DIAGNOSIS — E785 Hyperlipidemia, unspecified: Secondary | ICD-10-CM | POA: Diagnosis not present

## 2023-04-12 DIAGNOSIS — R634 Abnormal weight loss: Secondary | ICD-10-CM | POA: Diagnosis not present

## 2023-04-13 DIAGNOSIS — G309 Alzheimer's disease, unspecified: Secondary | ICD-10-CM | POA: Diagnosis not present

## 2023-04-13 DIAGNOSIS — R634 Abnormal weight loss: Secondary | ICD-10-CM | POA: Diagnosis not present

## 2023-04-13 DIAGNOSIS — N400 Enlarged prostate without lower urinary tract symptoms: Secondary | ICD-10-CM | POA: Diagnosis not present

## 2023-04-13 DIAGNOSIS — E785 Hyperlipidemia, unspecified: Secondary | ICD-10-CM | POA: Diagnosis not present

## 2023-04-13 DIAGNOSIS — I10 Essential (primary) hypertension: Secondary | ICD-10-CM | POA: Diagnosis not present

## 2023-04-13 DIAGNOSIS — F0280 Dementia in other diseases classified elsewhere without behavioral disturbance: Secondary | ICD-10-CM | POA: Diagnosis not present

## 2023-04-13 MED ORDER — GENTAMICIN SULFATE 0.3 % OP SOLN: 2.0000 [drp] | Freq: Four times a day (QID) | OPHTHALMIC | Status: AC

## 2023-04-14 DIAGNOSIS — E785 Hyperlipidemia, unspecified: Secondary | ICD-10-CM | POA: Diagnosis not present

## 2023-04-14 DIAGNOSIS — F0280 Dementia in other diseases classified elsewhere without behavioral disturbance: Secondary | ICD-10-CM | POA: Diagnosis not present

## 2023-04-14 DIAGNOSIS — R634 Abnormal weight loss: Secondary | ICD-10-CM | POA: Diagnosis not present

## 2023-04-14 DIAGNOSIS — N400 Enlarged prostate without lower urinary tract symptoms: Secondary | ICD-10-CM | POA: Diagnosis not present

## 2023-04-14 DIAGNOSIS — G309 Alzheimer's disease, unspecified: Secondary | ICD-10-CM | POA: Diagnosis not present

## 2023-04-14 DIAGNOSIS — I10 Essential (primary) hypertension: Secondary | ICD-10-CM | POA: Diagnosis not present

## 2023-04-15 DIAGNOSIS — E785 Hyperlipidemia, unspecified: Secondary | ICD-10-CM | POA: Diagnosis not present

## 2023-04-15 DIAGNOSIS — N400 Enlarged prostate without lower urinary tract symptoms: Secondary | ICD-10-CM | POA: Diagnosis not present

## 2023-04-15 DIAGNOSIS — R634 Abnormal weight loss: Secondary | ICD-10-CM | POA: Diagnosis not present

## 2023-04-15 DIAGNOSIS — F0280 Dementia in other diseases classified elsewhere without behavioral disturbance: Secondary | ICD-10-CM | POA: Diagnosis not present

## 2023-04-15 DIAGNOSIS — I10 Essential (primary) hypertension: Secondary | ICD-10-CM | POA: Diagnosis not present

## 2023-04-15 DIAGNOSIS — G309 Alzheimer's disease, unspecified: Secondary | ICD-10-CM | POA: Diagnosis not present

## 2023-04-16 DIAGNOSIS — G309 Alzheimer's disease, unspecified: Secondary | ICD-10-CM | POA: Diagnosis not present

## 2023-04-16 DIAGNOSIS — R634 Abnormal weight loss: Secondary | ICD-10-CM | POA: Diagnosis not present

## 2023-04-16 DIAGNOSIS — F0280 Dementia in other diseases classified elsewhere without behavioral disturbance: Secondary | ICD-10-CM | POA: Diagnosis not present

## 2023-04-16 DIAGNOSIS — N400 Enlarged prostate without lower urinary tract symptoms: Secondary | ICD-10-CM | POA: Diagnosis not present

## 2023-04-16 DIAGNOSIS — E785 Hyperlipidemia, unspecified: Secondary | ICD-10-CM | POA: Diagnosis not present

## 2023-04-16 DIAGNOSIS — I10 Essential (primary) hypertension: Secondary | ICD-10-CM | POA: Diagnosis not present

## 2023-04-17 DIAGNOSIS — I10 Essential (primary) hypertension: Secondary | ICD-10-CM | POA: Diagnosis not present

## 2023-04-17 DIAGNOSIS — G309 Alzheimer's disease, unspecified: Secondary | ICD-10-CM | POA: Diagnosis not present

## 2023-04-17 DIAGNOSIS — E785 Hyperlipidemia, unspecified: Secondary | ICD-10-CM | POA: Diagnosis not present

## 2023-04-17 DIAGNOSIS — N400 Enlarged prostate without lower urinary tract symptoms: Secondary | ICD-10-CM | POA: Diagnosis not present

## 2023-04-17 DIAGNOSIS — R634 Abnormal weight loss: Secondary | ICD-10-CM | POA: Diagnosis not present

## 2023-04-17 DIAGNOSIS — F0280 Dementia in other diseases classified elsewhere without behavioral disturbance: Secondary | ICD-10-CM | POA: Diagnosis not present

## 2023-04-18 DIAGNOSIS — I10 Essential (primary) hypertension: Secondary | ICD-10-CM | POA: Diagnosis not present

## 2023-04-18 DIAGNOSIS — E785 Hyperlipidemia, unspecified: Secondary | ICD-10-CM | POA: Diagnosis not present

## 2023-04-18 DIAGNOSIS — N400 Enlarged prostate without lower urinary tract symptoms: Secondary | ICD-10-CM | POA: Diagnosis not present

## 2023-04-18 DIAGNOSIS — F0280 Dementia in other diseases classified elsewhere without behavioral disturbance: Secondary | ICD-10-CM | POA: Diagnosis not present

## 2023-04-18 DIAGNOSIS — R634 Abnormal weight loss: Secondary | ICD-10-CM | POA: Diagnosis not present

## 2023-04-18 DIAGNOSIS — G309 Alzheimer's disease, unspecified: Secondary | ICD-10-CM | POA: Diagnosis not present

## 2023-04-19 DIAGNOSIS — G309 Alzheimer's disease, unspecified: Secondary | ICD-10-CM | POA: Diagnosis not present

## 2023-04-19 DIAGNOSIS — F0280 Dementia in other diseases classified elsewhere without behavioral disturbance: Secondary | ICD-10-CM | POA: Diagnosis not present

## 2023-04-19 DIAGNOSIS — E785 Hyperlipidemia, unspecified: Secondary | ICD-10-CM | POA: Diagnosis not present

## 2023-04-19 DIAGNOSIS — N400 Enlarged prostate without lower urinary tract symptoms: Secondary | ICD-10-CM | POA: Diagnosis not present

## 2023-04-19 DIAGNOSIS — R634 Abnormal weight loss: Secondary | ICD-10-CM | POA: Diagnosis not present

## 2023-04-19 DIAGNOSIS — I10 Essential (primary) hypertension: Secondary | ICD-10-CM | POA: Diagnosis not present

## 2023-04-20 DIAGNOSIS — R634 Abnormal weight loss: Secondary | ICD-10-CM | POA: Diagnosis not present

## 2023-04-20 DIAGNOSIS — G309 Alzheimer's disease, unspecified: Secondary | ICD-10-CM | POA: Diagnosis not present

## 2023-04-20 DIAGNOSIS — F0280 Dementia in other diseases classified elsewhere without behavioral disturbance: Secondary | ICD-10-CM | POA: Diagnosis not present

## 2023-04-20 DIAGNOSIS — I10 Essential (primary) hypertension: Secondary | ICD-10-CM | POA: Diagnosis not present

## 2023-04-20 DIAGNOSIS — E785 Hyperlipidemia, unspecified: Secondary | ICD-10-CM | POA: Diagnosis not present

## 2023-04-20 DIAGNOSIS — N400 Enlarged prostate without lower urinary tract symptoms: Secondary | ICD-10-CM | POA: Diagnosis not present

## 2023-04-21 DIAGNOSIS — E785 Hyperlipidemia, unspecified: Secondary | ICD-10-CM | POA: Diagnosis not present

## 2023-04-21 DIAGNOSIS — F0280 Dementia in other diseases classified elsewhere without behavioral disturbance: Secondary | ICD-10-CM | POA: Diagnosis not present

## 2023-04-21 DIAGNOSIS — N400 Enlarged prostate without lower urinary tract symptoms: Secondary | ICD-10-CM | POA: Diagnosis not present

## 2023-04-21 DIAGNOSIS — G309 Alzheimer's disease, unspecified: Secondary | ICD-10-CM | POA: Diagnosis not present

## 2023-04-21 DIAGNOSIS — R634 Abnormal weight loss: Secondary | ICD-10-CM | POA: Diagnosis not present

## 2023-04-21 DIAGNOSIS — I10 Essential (primary) hypertension: Secondary | ICD-10-CM | POA: Diagnosis not present

## 2023-04-22 ENCOUNTER — Encounter: Payer: Self-pay | Admitting: Internal Medicine

## 2023-04-22 ENCOUNTER — Non-Acute Institutional Stay (SKILLED_NURSING_FACILITY): Payer: Self-pay | Admitting: Internal Medicine

## 2023-04-22 DIAGNOSIS — D696 Thrombocytopenia, unspecified: Secondary | ICD-10-CM

## 2023-04-22 DIAGNOSIS — G301 Alzheimer's disease with late onset: Secondary | ICD-10-CM | POA: Diagnosis not present

## 2023-04-22 DIAGNOSIS — F02C11 Dementia in other diseases classified elsewhere, severe, with agitation: Secondary | ICD-10-CM

## 2023-04-22 DIAGNOSIS — R634 Abnormal weight loss: Secondary | ICD-10-CM | POA: Diagnosis not present

## 2023-04-22 DIAGNOSIS — I10 Essential (primary) hypertension: Secondary | ICD-10-CM | POA: Diagnosis not present

## 2023-04-22 DIAGNOSIS — N400 Enlarged prostate without lower urinary tract symptoms: Secondary | ICD-10-CM

## 2023-04-22 DIAGNOSIS — F0280 Dementia in other diseases classified elsewhere without behavioral disturbance: Secondary | ICD-10-CM | POA: Diagnosis not present

## 2023-04-22 DIAGNOSIS — E785 Hyperlipidemia, unspecified: Secondary | ICD-10-CM | POA: Diagnosis not present

## 2023-04-22 DIAGNOSIS — G309 Alzheimer's disease, unspecified: Secondary | ICD-10-CM | POA: Diagnosis not present

## 2023-04-22 NOTE — Progress Notes (Signed)
[facility] Room Number: 31AA Place of Service:  SNF (31)/Hospice Provider:  Mahlon Gammon, MD   Mahlon Gammon, MD  Patient Care Team: Mahlon Gammon, MD as PCP - General (Internal Medicine) Sinda Du, MD as Consulting Physician (Ophthalmology) Raeanne Barry, DMD (Dentistry)  Extended Emergency Contact Information Primary Emergency Contact: o'brien,mary Mobile Phone: [phone] Relation: Spouse Preferred language: English Secondary Emergency Contact: ROBINSON,JULIA Mobile Phone: [phone] Relation: Daughter Interpreter needed? No  Code Status:  DNR Hospice Goals of care: Advanced Directive information    04/22/2023   11:07 AM  Advanced Directives  Does Patient Have a Medical Advance Directive? Yes  Type of Advance Directive Out of facility DNR (pink MOST or yellow form)  Does patient want to make changes to medical advance directive? No - Patient declined  Copy of Healthcare Power of Attorney in Chart? No - copy requested  Pre-existing out of facility DNR order (yellow form or pink MOST form) Pink MOST form placed in chart (order not valid for inpatient use)     Chief Complaint  Patient presents with   Medical Management of Chronic Issues    Patient is being seen for a routine visit    Immunizations    Patient is being seen for flu and covid vaccine     HPI:  Pt is a 88 y.o. male seen today for medical management of chronic diseases.    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   brexpiprazole (REXULTI) 2 MG TABS tablet Take 2 mg by mouth at bedtime.   gentamicin (GARAMYCIN) 0.3 % ophthalmic solution Place 2 drops into both eyes 4 (four) times daily for 10 days.   haloperidol (HALDOL) 2 MG tablet Take 1 mg by mouth daily. At 4 pm   memantine (NAMENDA) 10 MG tablet Take 10 mg by mouth 2 (two) times daily.   Metoprolol Succinate 25 MG CS24 Take one tablet by mouth once daily.   Multiple Vitamin (MULTIVITAMIN) tablet Take 1 tablet by mouth daily.   pantoprazole (PROTONIX) 40 MG tablet TAKE 1 TABLET EVERY DAY   rivastigmine (EXELON) 13.3 MG/24HR Place 1 patch (13.3 mg total) onto the skin daily.   temazepam (RESTORIL) 30 MG capsule Take 1 capsule (30 mg total) by mouth at bedtime.   hydroxypropyl methylcellulose / hypromellose (ISOPTO TEARS / GONIOVISC) 2.5 % ophthalmic solution Place 1 drop into both eyes 2 (two)  times daily.   No facility-administered encounter medications on file as of 04/22/2023.    Review of Systems  Unable to perform ROS: Dementia    Immunization History  Administered Date(s) Administered   Fluad Quad(high Dose 65+) 04/10/2022   Influenza-Unspecified 05/06/2020   Moderna Covid-19 Vaccine Bivalent Booster 23yrs & up 11/02/2022   Moderna SARS-COV2 Booster Vaccination 11/04/2020, 12/11/2021   Moderna Sars-Covid-2 Vaccination 07/21/2019, 08/18/2019, 05/25/2020, 04/21/2021   PNEUMOCOCCAL CONJUGATE-20 05/02/2022    Pneumococcal-Unspecified 06/22/2014   Td 07/09/2010   Tdap 11/14/2022   Zoster Recombinant(Shingrix) 07/09/2010, 05/09/2018   Pertinent  Health Maintenance Due  Topic Date Due   INFLUENZA VACCINE  02/07/2023      04/24/2022    9:21 AM 10/29/2022    1:18 PM 11/19/2022    3:34 PM 02/01/2023   11:17 AM 02/11/2023   11:31 AM  Fall Risk  Falls in the past year? 0 0 0 1 1  Was there an injury with Fall? 0 0 0 1 1  Fall Risk Category Calculator 0 0 0 3 3  Fall Risk Category (Retired) Low      (RETIRED) Patient Fall Risk Level Low fall risk      Patient at Risk for Falls Due to No Fall Risks No Fall Risks No Fall Risks History of fall(s) History of fall(s);Impaired balance/gait;Impaired mobility  Fall risk Follow up Falls evaluation completed  Falls evaluation completed Falls evaluation completed Falls evaluation completed   Functional Status Survey:    Vitals:   04/22/23 1104  BP: 133/80  Pulse: 77  Resp: 18  Temp: (!) 97.3 F (36.3 C)  TempSrc: Temporal  SpO2: 98%  Weight: 178 lb (80.7 kg)  Height: 5\' 9"  (1.753 m)   Body mass index is 26.29 kg/m. Physical Exam Vitals reviewed.  Constitutional:      Appearance: Normal appearance.  HENT:     Head: Normocephalic.     Nose: Nose normal.     Mouth/Throat:     Mouth: Mucous membranes are moist.     Pharynx: Oropharynx is clear.  Eyes:     Pupils: Pupils are equal, round, and reactive to light.  Cardiovascular:     Rate and Rhythm: Normal rate and regular rhythm.     Pulses: Normal pulses.     Heart sounds: No murmur heard. Pulmonary:     Effort: Pulmonary effort is normal. No respiratory distress.     Breath sounds: Normal breath sounds. No rales.  Abdominal:     General: Abdomen is flat. Bowel sounds are normal.     Palpations: Abdomen is soft.  Musculoskeletal:        General: No swelling.     Cervical back: Neck supple.  Skin:    General: Skin is warm.  Neurological:     General: No focal deficit present.      Mental Status: He is alert and oriented to person, place, and time.  Psychiatric:        Mood and Affect: Mood normal.        Thought Content: Thought content normal.     Labs reviewed: Recent Labs    10/23/22 0000 11/13/22 1319  NA 137 135  K 4.6 4.4  CL 100 97*  CO2 26* 31  GLUCOSE  --  121*  BUN 13 12  CREATININE 1.1 1.11  CALCIUM 9.6 9.9   Recent Labs    10/23/22 0000 11/13/22 1319  AST 46* 47*  ALT 32 31  ALKPHOS 112  88  BILITOT  --  1.6*  PROT  --  7.5  ALBUMIN 4.3 4.4   Recent Labs    10/23/22 0000 10/25/22 0000 11/13/22 1056  WBC 2.4 2.3 2.5*  NEUTROABS  --  1.20 1.5*  HGB 14.1 13.9 14.2  HCT 41  --  42.3  MCV  --   --  88.9  PLT 65* 68* 73*   Lab Results  Component Value Date   TSH 1.16 03/01/2022   No results found for: "HGBA1C" Lab Results  Component Value Date   CHOL 200 03/01/2022   HDL 85 (A) 03/01/2022   LDLCALC 96 03/01/2022   TRIG 95 03/01/2022    Significant Diagnostic Results in last 30 days:  No results found.  Assessment/Plan 1. 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 Benign prostatic hyperplasia without lower urinary tract symptoms Off all meds  3. Essential hypertension Lopressor  4. 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

## 2023-04-23 DIAGNOSIS — E785 Hyperlipidemia, unspecified: Secondary | ICD-10-CM | POA: Diagnosis not present

## 2023-04-23 DIAGNOSIS — R634 Abnormal weight loss: Secondary | ICD-10-CM | POA: Diagnosis not present

## 2023-04-23 DIAGNOSIS — F0280 Dementia in other diseases classified elsewhere without behavioral disturbance: Secondary | ICD-10-CM | POA: Diagnosis not present

## 2023-04-23 DIAGNOSIS — I10 Essential (primary) hypertension: Secondary | ICD-10-CM | POA: Diagnosis not present

## 2023-04-23 DIAGNOSIS — G309 Alzheimer's disease, unspecified: Secondary | ICD-10-CM | POA: Diagnosis not present

## 2023-04-23 DIAGNOSIS — N400 Enlarged prostate without lower urinary tract symptoms: Secondary | ICD-10-CM | POA: Diagnosis not present

## 2023-04-24 DIAGNOSIS — F0280 Dementia in other diseases classified elsewhere without behavioral disturbance: Secondary | ICD-10-CM | POA: Diagnosis not present

## 2023-04-24 DIAGNOSIS — G309 Alzheimer's disease, unspecified: Secondary | ICD-10-CM | POA: Diagnosis not present

## 2023-04-24 DIAGNOSIS — E785 Hyperlipidemia, unspecified: Secondary | ICD-10-CM | POA: Diagnosis not present

## 2023-04-24 DIAGNOSIS — I10 Essential (primary) hypertension: Secondary | ICD-10-CM | POA: Diagnosis not present

## 2023-04-24 DIAGNOSIS — N400 Enlarged prostate without lower urinary tract symptoms: Secondary | ICD-10-CM | POA: Diagnosis not present

## 2023-04-24 DIAGNOSIS — R634 Abnormal weight loss: Secondary | ICD-10-CM | POA: Diagnosis not present

## 2023-04-25 DIAGNOSIS — E785 Hyperlipidemia, unspecified: Secondary | ICD-10-CM | POA: Diagnosis not present

## 2023-04-25 DIAGNOSIS — I10 Essential (primary) hypertension: Secondary | ICD-10-CM | POA: Diagnosis not present

## 2023-04-25 DIAGNOSIS — F0280 Dementia in other diseases classified elsewhere without behavioral disturbance: Secondary | ICD-10-CM | POA: Diagnosis not present

## 2023-04-25 DIAGNOSIS — N400 Enlarged prostate without lower urinary tract symptoms: Secondary | ICD-10-CM | POA: Diagnosis not present

## 2023-04-25 DIAGNOSIS — R634 Abnormal weight loss: Secondary | ICD-10-CM | POA: Diagnosis not present

## 2023-04-25 DIAGNOSIS — G309 Alzheimer's disease, unspecified: Secondary | ICD-10-CM | POA: Diagnosis not present

## 2023-04-26 DIAGNOSIS — N400 Enlarged prostate without lower urinary tract symptoms: Secondary | ICD-10-CM | POA: Diagnosis not present

## 2023-04-26 DIAGNOSIS — F0280 Dementia in other diseases classified elsewhere without behavioral disturbance: Secondary | ICD-10-CM | POA: Diagnosis not present

## 2023-04-26 DIAGNOSIS — E785 Hyperlipidemia, unspecified: Secondary | ICD-10-CM | POA: Diagnosis not present

## 2023-04-26 DIAGNOSIS — G309 Alzheimer's disease, unspecified: Secondary | ICD-10-CM | POA: Diagnosis not present

## 2023-04-26 DIAGNOSIS — R634 Abnormal weight loss: Secondary | ICD-10-CM | POA: Diagnosis not present

## 2023-04-26 DIAGNOSIS — I10 Essential (primary) hypertension: Secondary | ICD-10-CM | POA: Diagnosis not present

## 2023-04-27 DIAGNOSIS — R634 Abnormal weight loss: Secondary | ICD-10-CM | POA: Diagnosis not present

## 2023-04-27 DIAGNOSIS — E785 Hyperlipidemia, unspecified: Secondary | ICD-10-CM | POA: Diagnosis not present

## 2023-04-27 DIAGNOSIS — G309 Alzheimer's disease, unspecified: Secondary | ICD-10-CM | POA: Diagnosis not present

## 2023-04-27 DIAGNOSIS — N400 Enlarged prostate without lower urinary tract symptoms: Secondary | ICD-10-CM | POA: Diagnosis not present

## 2023-04-27 DIAGNOSIS — F0280 Dementia in other diseases classified elsewhere without behavioral disturbance: Secondary | ICD-10-CM | POA: Diagnosis not present

## 2023-04-27 DIAGNOSIS — I10 Essential (primary) hypertension: Secondary | ICD-10-CM | POA: Diagnosis not present

## 2023-04-28 DIAGNOSIS — I10 Essential (primary) hypertension: Secondary | ICD-10-CM | POA: Diagnosis not present

## 2023-04-28 DIAGNOSIS — R634 Abnormal weight loss: Secondary | ICD-10-CM | POA: Diagnosis not present

## 2023-04-28 DIAGNOSIS — F0280 Dementia in other diseases classified elsewhere without behavioral disturbance: Secondary | ICD-10-CM | POA: Diagnosis not present

## 2023-04-28 DIAGNOSIS — E785 Hyperlipidemia, unspecified: Secondary | ICD-10-CM | POA: Diagnosis not present

## 2023-04-28 DIAGNOSIS — G309 Alzheimer's disease, unspecified: Secondary | ICD-10-CM | POA: Diagnosis not present

## 2023-04-28 DIAGNOSIS — N400 Enlarged prostate without lower urinary tract symptoms: Secondary | ICD-10-CM | POA: Diagnosis not present

## 2023-04-29 DIAGNOSIS — E785 Hyperlipidemia, unspecified: Secondary | ICD-10-CM | POA: Diagnosis not present

## 2023-04-29 DIAGNOSIS — F0280 Dementia in other diseases classified elsewhere without behavioral disturbance: Secondary | ICD-10-CM | POA: Diagnosis not present

## 2023-04-29 DIAGNOSIS — R634 Abnormal weight loss: Secondary | ICD-10-CM | POA: Diagnosis not present

## 2023-04-29 DIAGNOSIS — I10 Essential (primary) hypertension: Secondary | ICD-10-CM | POA: Diagnosis not present

## 2023-04-29 DIAGNOSIS — N400 Enlarged prostate without lower urinary tract symptoms: Secondary | ICD-10-CM | POA: Diagnosis not present

## 2023-04-29 DIAGNOSIS — G309 Alzheimer's disease, unspecified: Secondary | ICD-10-CM | POA: Diagnosis not present

## 2023-04-30 DIAGNOSIS — N400 Enlarged prostate without lower urinary tract symptoms: Secondary | ICD-10-CM | POA: Diagnosis not present

## 2023-04-30 DIAGNOSIS — E785 Hyperlipidemia, unspecified: Secondary | ICD-10-CM | POA: Diagnosis not present

## 2023-04-30 DIAGNOSIS — G309 Alzheimer's disease, unspecified: Secondary | ICD-10-CM | POA: Diagnosis not present

## 2023-04-30 DIAGNOSIS — F0280 Dementia in other diseases classified elsewhere without behavioral disturbance: Secondary | ICD-10-CM | POA: Diagnosis not present

## 2023-04-30 DIAGNOSIS — R634 Abnormal weight loss: Secondary | ICD-10-CM | POA: Diagnosis not present

## 2023-04-30 DIAGNOSIS — Z23 Encounter for immunization: Secondary | ICD-10-CM | POA: Diagnosis not present

## 2023-04-30 DIAGNOSIS — I10 Essential (primary) hypertension: Secondary | ICD-10-CM | POA: Diagnosis not present

## 2023-05-01 DIAGNOSIS — R634 Abnormal weight loss: Secondary | ICD-10-CM | POA: Diagnosis not present

## 2023-05-01 DIAGNOSIS — G309 Alzheimer's disease, unspecified: Secondary | ICD-10-CM | POA: Diagnosis not present

## 2023-05-01 DIAGNOSIS — F0280 Dementia in other diseases classified elsewhere without behavioral disturbance: Secondary | ICD-10-CM | POA: Diagnosis not present

## 2023-05-01 DIAGNOSIS — I10 Essential (primary) hypertension: Secondary | ICD-10-CM | POA: Diagnosis not present

## 2023-05-01 DIAGNOSIS — E785 Hyperlipidemia, unspecified: Secondary | ICD-10-CM | POA: Diagnosis not present

## 2023-05-01 DIAGNOSIS — N400 Enlarged prostate without lower urinary tract symptoms: Secondary | ICD-10-CM | POA: Diagnosis not present

## 2023-05-02 DIAGNOSIS — G309 Alzheimer's disease, unspecified: Secondary | ICD-10-CM | POA: Diagnosis not present

## 2023-05-02 DIAGNOSIS — F0280 Dementia in other diseases classified elsewhere without behavioral disturbance: Secondary | ICD-10-CM | POA: Diagnosis not present

## 2023-05-02 DIAGNOSIS — I10 Essential (primary) hypertension: Secondary | ICD-10-CM | POA: Diagnosis not present

## 2023-05-02 DIAGNOSIS — N400 Enlarged prostate without lower urinary tract symptoms: Secondary | ICD-10-CM | POA: Diagnosis not present

## 2023-05-02 DIAGNOSIS — R634 Abnormal weight loss: Secondary | ICD-10-CM | POA: Diagnosis not present

## 2023-05-02 DIAGNOSIS — E785 Hyperlipidemia, unspecified: Secondary | ICD-10-CM | POA: Diagnosis not present

## 2023-05-03 DIAGNOSIS — R634 Abnormal weight loss: Secondary | ICD-10-CM | POA: Diagnosis not present

## 2023-05-03 DIAGNOSIS — F0280 Dementia in other diseases classified elsewhere without behavioral disturbance: Secondary | ICD-10-CM | POA: Diagnosis not present

## 2023-05-03 DIAGNOSIS — E785 Hyperlipidemia, unspecified: Secondary | ICD-10-CM | POA: Diagnosis not present

## 2023-05-03 DIAGNOSIS — N400 Enlarged prostate without lower urinary tract symptoms: Secondary | ICD-10-CM | POA: Diagnosis not present

## 2023-05-03 DIAGNOSIS — I10 Essential (primary) hypertension: Secondary | ICD-10-CM | POA: Diagnosis not present

## 2023-05-03 DIAGNOSIS — F332 Major depressive disorder, recurrent severe without psychotic features: Secondary | ICD-10-CM | POA: Diagnosis not present

## 2023-05-03 DIAGNOSIS — G309 Alzheimer's disease, unspecified: Secondary | ICD-10-CM | POA: Diagnosis not present

## 2023-05-04 DIAGNOSIS — G309 Alzheimer's disease, unspecified: Secondary | ICD-10-CM | POA: Diagnosis not present

## 2023-05-04 DIAGNOSIS — F0280 Dementia in other diseases classified elsewhere without behavioral disturbance: Secondary | ICD-10-CM | POA: Diagnosis not present

## 2023-05-04 DIAGNOSIS — E785 Hyperlipidemia, unspecified: Secondary | ICD-10-CM | POA: Diagnosis not present

## 2023-05-04 DIAGNOSIS — R634 Abnormal weight loss: Secondary | ICD-10-CM | POA: Diagnosis not present

## 2023-05-04 DIAGNOSIS — N400 Enlarged prostate without lower urinary tract symptoms: Secondary | ICD-10-CM | POA: Diagnosis not present

## 2023-05-04 DIAGNOSIS — I10 Essential (primary) hypertension: Secondary | ICD-10-CM | POA: Diagnosis not present

## 2023-05-05 DIAGNOSIS — E785 Hyperlipidemia, unspecified: Secondary | ICD-10-CM | POA: Diagnosis not present

## 2023-05-05 DIAGNOSIS — I10 Essential (primary) hypertension: Secondary | ICD-10-CM | POA: Diagnosis not present

## 2023-05-05 DIAGNOSIS — G309 Alzheimer's disease, unspecified: Secondary | ICD-10-CM | POA: Diagnosis not present

## 2023-05-05 DIAGNOSIS — F0280 Dementia in other diseases classified elsewhere without behavioral disturbance: Secondary | ICD-10-CM | POA: Diagnosis not present

## 2023-05-05 DIAGNOSIS — R634 Abnormal weight loss: Secondary | ICD-10-CM | POA: Diagnosis not present

## 2023-05-05 DIAGNOSIS — N400 Enlarged prostate without lower urinary tract symptoms: Secondary | ICD-10-CM | POA: Diagnosis not present

## 2023-05-06 DIAGNOSIS — I10 Essential (primary) hypertension: Secondary | ICD-10-CM | POA: Diagnosis not present

## 2023-05-06 DIAGNOSIS — R634 Abnormal weight loss: Secondary | ICD-10-CM | POA: Diagnosis not present

## 2023-05-06 DIAGNOSIS — N400 Enlarged prostate without lower urinary tract symptoms: Secondary | ICD-10-CM | POA: Diagnosis not present

## 2023-05-06 DIAGNOSIS — E785 Hyperlipidemia, unspecified: Secondary | ICD-10-CM | POA: Diagnosis not present

## 2023-05-06 DIAGNOSIS — G309 Alzheimer's disease, unspecified: Secondary | ICD-10-CM | POA: Diagnosis not present

## 2023-05-06 DIAGNOSIS — F0280 Dementia in other diseases classified elsewhere without behavioral disturbance: Secondary | ICD-10-CM | POA: Diagnosis not present

## 2023-05-07 DIAGNOSIS — I10 Essential (primary) hypertension: Secondary | ICD-10-CM | POA: Diagnosis not present

## 2023-05-07 DIAGNOSIS — E785 Hyperlipidemia, unspecified: Secondary | ICD-10-CM | POA: Diagnosis not present

## 2023-05-07 DIAGNOSIS — N400 Enlarged prostate without lower urinary tract symptoms: Secondary | ICD-10-CM | POA: Diagnosis not present

## 2023-05-07 DIAGNOSIS — R634 Abnormal weight loss: Secondary | ICD-10-CM | POA: Diagnosis not present

## 2023-05-07 DIAGNOSIS — G309 Alzheimer's disease, unspecified: Secondary | ICD-10-CM | POA: Diagnosis not present

## 2023-05-07 DIAGNOSIS — F0280 Dementia in other diseases classified elsewhere without behavioral disturbance: Secondary | ICD-10-CM | POA: Diagnosis not present

## 2023-05-08 DIAGNOSIS — I10 Essential (primary) hypertension: Secondary | ICD-10-CM | POA: Diagnosis not present

## 2023-05-08 DIAGNOSIS — R634 Abnormal weight loss: Secondary | ICD-10-CM | POA: Diagnosis not present

## 2023-05-08 DIAGNOSIS — G309 Alzheimer's disease, unspecified: Secondary | ICD-10-CM | POA: Diagnosis not present

## 2023-05-08 DIAGNOSIS — N400 Enlarged prostate without lower urinary tract symptoms: Secondary | ICD-10-CM | POA: Diagnosis not present

## 2023-05-08 DIAGNOSIS — F0280 Dementia in other diseases classified elsewhere without behavioral disturbance: Secondary | ICD-10-CM | POA: Diagnosis not present

## 2023-05-08 DIAGNOSIS — E785 Hyperlipidemia, unspecified: Secondary | ICD-10-CM | POA: Diagnosis not present

## 2023-05-09 DIAGNOSIS — E785 Hyperlipidemia, unspecified: Secondary | ICD-10-CM | POA: Diagnosis not present

## 2023-05-09 DIAGNOSIS — N400 Enlarged prostate without lower urinary tract symptoms: Secondary | ICD-10-CM | POA: Diagnosis not present

## 2023-05-09 DIAGNOSIS — F0280 Dementia in other diseases classified elsewhere without behavioral disturbance: Secondary | ICD-10-CM | POA: Diagnosis not present

## 2023-05-09 DIAGNOSIS — G309 Alzheimer's disease, unspecified: Secondary | ICD-10-CM | POA: Diagnosis not present

## 2023-05-09 DIAGNOSIS — R634 Abnormal weight loss: Secondary | ICD-10-CM | POA: Diagnosis not present

## 2023-05-09 DIAGNOSIS — I10 Essential (primary) hypertension: Secondary | ICD-10-CM | POA: Diagnosis not present

## 2023-05-10 DIAGNOSIS — F0280 Dementia in other diseases classified elsewhere without behavioral disturbance: Secondary | ICD-10-CM | POA: Diagnosis not present

## 2023-05-10 DIAGNOSIS — I10 Essential (primary) hypertension: Secondary | ICD-10-CM | POA: Diagnosis not present

## 2023-05-10 DIAGNOSIS — N400 Enlarged prostate without lower urinary tract symptoms: Secondary | ICD-10-CM | POA: Diagnosis not present

## 2023-05-10 DIAGNOSIS — E785 Hyperlipidemia, unspecified: Secondary | ICD-10-CM | POA: Diagnosis not present

## 2023-05-10 DIAGNOSIS — R634 Abnormal weight loss: Secondary | ICD-10-CM | POA: Diagnosis not present

## 2023-05-10 DIAGNOSIS — K219 Gastro-esophageal reflux disease without esophagitis: Secondary | ICD-10-CM | POA: Diagnosis not present

## 2023-05-10 DIAGNOSIS — G309 Alzheimer's disease, unspecified: Secondary | ICD-10-CM | POA: Diagnosis not present

## 2023-05-10 DIAGNOSIS — C4492 Squamous cell carcinoma of skin, unspecified: Secondary | ICD-10-CM | POA: Diagnosis not present

## 2023-05-13 DIAGNOSIS — G309 Alzheimer's disease, unspecified: Secondary | ICD-10-CM | POA: Diagnosis not present

## 2023-05-13 DIAGNOSIS — F0280 Dementia in other diseases classified elsewhere without behavioral disturbance: Secondary | ICD-10-CM | POA: Diagnosis not present

## 2023-05-13 DIAGNOSIS — E785 Hyperlipidemia, unspecified: Secondary | ICD-10-CM | POA: Diagnosis not present

## 2023-05-13 DIAGNOSIS — N400 Enlarged prostate without lower urinary tract symptoms: Secondary | ICD-10-CM | POA: Diagnosis not present

## 2023-05-13 DIAGNOSIS — R634 Abnormal weight loss: Secondary | ICD-10-CM | POA: Diagnosis not present

## 2023-05-13 DIAGNOSIS — I10 Essential (primary) hypertension: Secondary | ICD-10-CM | POA: Diagnosis not present

## 2023-05-16 DIAGNOSIS — R634 Abnormal weight loss: Secondary | ICD-10-CM | POA: Diagnosis not present

## 2023-05-16 DIAGNOSIS — I10 Essential (primary) hypertension: Secondary | ICD-10-CM | POA: Diagnosis not present

## 2023-05-16 DIAGNOSIS — F0280 Dementia in other diseases classified elsewhere without behavioral disturbance: Secondary | ICD-10-CM | POA: Diagnosis not present

## 2023-05-16 DIAGNOSIS — N400 Enlarged prostate without lower urinary tract symptoms: Secondary | ICD-10-CM | POA: Diagnosis not present

## 2023-05-16 DIAGNOSIS — E785 Hyperlipidemia, unspecified: Secondary | ICD-10-CM | POA: Diagnosis not present

## 2023-05-16 DIAGNOSIS — G309 Alzheimer's disease, unspecified: Secondary | ICD-10-CM | POA: Diagnosis not present

## 2023-05-20 ENCOUNTER — Encounter: Payer: Self-pay | Admitting: Adult Health

## 2023-05-20 ENCOUNTER — Non-Acute Institutional Stay (SKILLED_NURSING_FACILITY): Payer: Self-pay | Admitting: Adult Health

## 2023-05-20 DIAGNOSIS — R634 Abnormal weight loss: Secondary | ICD-10-CM | POA: Diagnosis not present

## 2023-05-20 DIAGNOSIS — E785 Hyperlipidemia, unspecified: Secondary | ICD-10-CM | POA: Diagnosis not present

## 2023-05-20 DIAGNOSIS — D696 Thrombocytopenia, unspecified: Secondary | ICD-10-CM | POA: Diagnosis not present

## 2023-05-20 DIAGNOSIS — E440 Moderate protein-calorie malnutrition: Secondary | ICD-10-CM

## 2023-05-20 DIAGNOSIS — F5101 Primary insomnia: Secondary | ICD-10-CM | POA: Diagnosis not present

## 2023-05-20 DIAGNOSIS — G301 Alzheimer's disease with late onset: Secondary | ICD-10-CM

## 2023-05-20 DIAGNOSIS — F02C11 Dementia in other diseases classified elsewhere, severe, with agitation: Secondary | ICD-10-CM

## 2023-05-20 DIAGNOSIS — L89600 Pressure ulcer of unspecified heel, unstageable: Secondary | ICD-10-CM | POA: Diagnosis not present

## 2023-05-20 DIAGNOSIS — F0280 Dementia in other diseases classified elsewhere without behavioral disturbance: Secondary | ICD-10-CM | POA: Diagnosis not present

## 2023-05-20 DIAGNOSIS — G309 Alzheimer's disease, unspecified: Secondary | ICD-10-CM | POA: Diagnosis not present

## 2023-05-20 DIAGNOSIS — I10 Essential (primary) hypertension: Secondary | ICD-10-CM | POA: Diagnosis not present

## 2023-05-20 DIAGNOSIS — N400 Enlarged prostate without lower urinary tract symptoms: Secondary | ICD-10-CM | POA: Diagnosis not present

## 2023-05-20 NOTE — Addendum Note (Signed)
Addended by: Fletcher Anon L on: 05/20/2023 01:00 PM   Modules accepted: Level of Service

## 2023-05-20 NOTE — Progress Notes (Addendum)
Location:  [facility] Room Number: 311A Place of Service:  S[facility] (31) Hospice.  Provider:  Tamsen Roers, MD  Patient Care Team: Mahlon Gammon, MD as PCP - General (Internal Medicine) Sinda Du, MD as Consulting Physician (Ophthalmology) Raeanne Barry, DMD (Dentistry)  Extended Emergency Contact Information Primary Emergency Contact: o'brien,mary Mobile Phone: [phone] Relation: Spouse Preferred language: English Secondary Emergency Contact: ROBINSON,JULIA Mobile Phone: [phone] Relation: Daughter Interpreter needed? No  Code Status:  DNR Goals of care: Advanced Directive information    05/20/2023   10:06 AM  Advanced Directives  Does Patient Have a Medical Advance Directive? Yes  Type of Advance Directive Out of facility DNR (pink MOST or yellow form)  Does patient want to make changes to medical advance directive? No - Patient declined  Copy of Healthcare Power of Attorney in Chart? No - copy requested  Pre-existing out of facility DNR order (yellow form or pink MOST form) Pink MOST form placed in chart (order not valid for inpatient use)     Chief Complaint  Patient presents with   Acute Visit    Patient is being seen for acute blisters on is feet     HPI:  Pt is a 88 y.o. male seen today for an acute visit for blisters on his feet and medical management of chronic disease.   He is followed by hospice due to progressing dementia. He an ambulate with assistance but mostly uses the WC. He is minimally verbal and needs help with all ADLs.   The nurse reports she noticed blisters to both feet that are not painful.  He has been losing weight down 7 lbs in one month but did gain 2 lbs back Has edema in both feet. No sob.  Wt Readings from Last 3 Encounters:  05/20/23 180 lb 6.4 oz (81.8 kg)  04/22/23 178 lb (80.7 kg)  03/15/23 185 lb 3.2 oz (84 kg)   Eating adequate amounts for lunch and  dinner, 0% recorded at breakfast.   He has a hx low white count and low platelets. He was referred to hematology and seen by Dr Truett Perna on 11/13/22.  He was not felt to be a candidate for bone marrow bx. The differential includes myelodysplasia and cirrhosis due to heavy alcohol use.   He is followed by Dr Donell Beers and  recently was tapered of haldol. No reports of aggression and seems to be tolerating being off haldol well.   Bps reviewed  Blood Pressure: 144 / 71 mmHg  Blood Pressure: 134 / 66 mmHg  Blood Pressure: 117 / 56 mmHg  Blood Pressure: 148 / 62 mmHg  Blood Pressure: 130 / 65 mmHg  Past Medical History:  Diagnosis Date   BPH (benign prostatic hyperplasia)    Chronic kidney disease    Dementia (HCC)    GERD (gastroesophageal reflux disease)    Hyperlipidemia    Hypertension    Past Surgical History:  Procedure Laterality Date   TRANSURETHRAL RESECTION OF PROSTATE      No Known Allergies  Outpatient Encounter Medications as of 05/20/2023  Medication Sig   B Complex Vitamins (VITAMIN B COMPLEX) TABS Take 1 tablet by mouth daily.   brexpiprazole (REXULTI) 2 MG TABS tablet Take 2 mg by mouth at bedtime.   memantine (NAMENDA) 10 MG tablet Take 10 mg by mouth 2 (two) times daily.   Metoprolol Succinate 25 MG CS24 Take one tablet by mouth once daily.  Multiple Vitamin (MULTIVITAMIN) tablet Take 1 tablet by mouth daily.   pantoprazole (PROTONIX) 40 MG tablet TAKE 1 TABLET EVERY DAY   polyvinyl alcohol (LIQUIFILM TEARS) 1.4 % ophthalmic solution Place 2 drops into both eyes as needed for dry eyes.   rivastigmine (EXELON) 13.3 MG/24HR Place 1 patch (13.3 mg total) onto the skin daily.   temazepam (RESTORIL) 30 MG capsule Take 1 capsule (30 mg total) by mouth at bedtime.   haloperidol (HALDOL) 2 MG tablet Take 1 mg by mouth daily. At 4 pm (Patient not taking: Reported on 05/20/2023)   hydroxypropyl methylcellulose / hypromellose (ISOPTO TEARS / GONIOVISC) 2.5 % ophthalmic  solution Place 1 drop into both eyes 2 (two) times daily. (Patient not taking: Reported on 05/20/2023)   No facility-administered encounter medications on file as of 05/20/2023.    Review of Systems  Unable to perform ROS: Dementia    Immunization History  Administered Date(s) Administered   Fluad Quad(high Dose 65+) 04/10/2022, 04/30/2023   Influenza-Unspecified 05/06/2020   Moderna Covid-19 Vaccine Bivalent Booster 79yrs & up 11/02/2022, 04/30/2023   Moderna SARS-COV2 Booster Vaccination 11/04/2020, 12/11/2021   Moderna Sars-Covid-2 Vaccination 07/21/2019, 08/18/2019, 05/25/2020, 04/21/2021   PNEUMOCOCCAL CONJUGATE-20 05/02/2022   Pneumococcal-Unspecified 06/22/2014   Td 07/09/2010   Tdap 11/14/2022   Zoster Recombinant(Shingrix) 07/09/2010, 05/09/2018   Pertinent  Health Maintenance Due  Topic Date Due   INFLUENZA VACCINE  Completed      04/24/2022    9:21 AM 10/29/2022    1:18 PM 11/19/2022    3:34 PM 02/01/2023   11:17 AM 02/11/2023   11:31 AM  Fall Risk  Falls in the past year? 0 0 0 1 1  Was there an injury with Fall? 0 0 0 1 1  Fall Risk Category Calculator 0 0 0 3 3  Fall Risk Category (Retired) Low      (RETIRED) Patient Fall Risk Level Low fall risk      Patient at Risk for Falls Due to No Fall Risks No Fall Risks No Fall Risks History of fall(s) History of fall(s);Impaired balance/gait;Impaired mobility  Fall risk Follow up Falls evaluation completed  Falls evaluation completed Falls evaluation completed Falls evaluation completed   Functional Status Survey:    Vitals:   05/20/23 1001  BP: (!) 144/71  Pulse: 72  Resp: 18  Temp: 97.7 F (36.5 C)  TempSrc: Temporal  SpO2: 98%  Weight: 180 lb 6.4 oz (81.8 kg)  Height: 5\' 9"  (1.753 m)   Body mass index is 26.64 kg/m. Physical Exam Vitals and nursing note reviewed.  Constitutional:      General: He is not in acute distress.    Appearance: He is not diaphoretic.  HENT:     Head: Normocephalic and  atraumatic.     Right Ear: Tympanic membrane normal.     Left Ear: Tympanic membrane normal.     Nose: Nose normal.     Mouth/Throat:     Mouth: Mucous membranes are moist.     Pharynx: Oropharynx is clear.  Eyes:     Conjunctiva/sclera: Conjunctivae normal.     Pupils: Pupils are equal, round, and reactive to light.     Comments: Mild yellow drainage both eyes.   Neck:     Thyroid: No thyromegaly.     Vascular: No JVD.     Trachea: No tracheal deviation.  Cardiovascular:     Rate and Rhythm: Normal rate and regular rhythm.     Heart sounds: No murmur  heard. Pulmonary:     Effort: Pulmonary effort is normal. No respiratory distress.     Breath sounds: Normal breath sounds. No wheezing.  Abdominal:     General: Bowel sounds are normal. There is no distension.     Palpations: Abdomen is soft.     Tenderness: There is no abdominal tenderness.  Musculoskeletal:     Comments: BLE edema +2 Blister to both heels with boggy texture. Mild erythema blanching. Right later mid foot area with eschar blister area. Not tender.  BPPP+2  Lymphadenopathy:     Cervical: No cervical adenopathy.  Skin:    General: Skin is warm and dry.  Neurological:     Mental Status: He is alert and oriented to person, place, and time.     Cranial Nerves: No cranial nerve deficit.     Labs reviewed: Recent Labs    10/23/22 0000 11/13/22 1319  NA 137 135  K 4.6 4.4  CL 100 97*  CO2 26* 31  GLUCOSE  --  121*  BUN 13 12  CREATININE 1.1 1.11  CALCIUM 9.6 9.9   Recent Labs    10/23/22 0000 11/13/22 1319  AST 46* 47*  ALT 32 31  ALKPHOS 112 88  BILITOT  --  1.6*  PROT  --  7.5  ALBUMIN 4.3 4.4   Recent Labs    10/23/22 0000 10/25/22 0000 11/13/22 1056  WBC 2.4 2.3 2.5*  NEUTROABS  --  1.20 1.5*  HGB 14.1 13.9 14.2  HCT 41  --  42.3  MCV  --   --  88.9  PLT 65* 68* 73*   Lab Results  Component Value Date   TSH 1.16 03/01/2022   No results found for: "HGBA1C" Lab Results   Component Value Date   CHOL 200 03/01/2022   HDL 85 (A) 03/01/2022   LDLCALC 96 03/01/2022   TRIG 95 03/01/2022    Significant Diagnostic Results in last 30 days:  No results found.  Assessment/Plan  1. Pressure injury of heel, unstageable, unspecified laterality (HCC) Remove shoes Avoid pressure Apply skin prep, monitor site Boots for protection  2. Moderate protein-calorie malnutrition (HCC) Discussed with hospice Now that he is more alert off haldol we are hoping his intake will improve Due to his goals of care/hospice status did not order additional supplements.   3. 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   4. 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   5. Primary insomnia Improved with restoril  Family/ staff Communication: nurse  Labs/tests ordered:  NA

## 2023-05-27 DIAGNOSIS — E785 Hyperlipidemia, unspecified: Secondary | ICD-10-CM | POA: Diagnosis not present

## 2023-05-27 DIAGNOSIS — R634 Abnormal weight loss: Secondary | ICD-10-CM | POA: Diagnosis not present

## 2023-05-27 DIAGNOSIS — F0280 Dementia in other diseases classified elsewhere without behavioral disturbance: Secondary | ICD-10-CM | POA: Diagnosis not present

## 2023-05-27 DIAGNOSIS — N400 Enlarged prostate without lower urinary tract symptoms: Secondary | ICD-10-CM | POA: Diagnosis not present

## 2023-05-27 DIAGNOSIS — I10 Essential (primary) hypertension: Secondary | ICD-10-CM | POA: Diagnosis not present

## 2023-05-27 DIAGNOSIS — G309 Alzheimer's disease, unspecified: Secondary | ICD-10-CM | POA: Diagnosis not present

## 2023-05-29 DIAGNOSIS — G309 Alzheimer's disease, unspecified: Secondary | ICD-10-CM | POA: Diagnosis not present

## 2023-05-29 DIAGNOSIS — F0280 Dementia in other diseases classified elsewhere without behavioral disturbance: Secondary | ICD-10-CM | POA: Diagnosis not present

## 2023-05-29 DIAGNOSIS — R634 Abnormal weight loss: Secondary | ICD-10-CM | POA: Diagnosis not present

## 2023-05-29 DIAGNOSIS — I10 Essential (primary) hypertension: Secondary | ICD-10-CM | POA: Diagnosis not present

## 2023-05-29 DIAGNOSIS — E785 Hyperlipidemia, unspecified: Secondary | ICD-10-CM | POA: Diagnosis not present

## 2023-05-29 DIAGNOSIS — N400 Enlarged prostate without lower urinary tract symptoms: Secondary | ICD-10-CM | POA: Diagnosis not present

## 2023-06-03 DIAGNOSIS — E785 Hyperlipidemia, unspecified: Secondary | ICD-10-CM | POA: Diagnosis not present

## 2023-06-03 DIAGNOSIS — N400 Enlarged prostate without lower urinary tract symptoms: Secondary | ICD-10-CM | POA: Diagnosis not present

## 2023-06-03 DIAGNOSIS — R634 Abnormal weight loss: Secondary | ICD-10-CM | POA: Diagnosis not present

## 2023-06-03 DIAGNOSIS — F0280 Dementia in other diseases classified elsewhere without behavioral disturbance: Secondary | ICD-10-CM | POA: Diagnosis not present

## 2023-06-03 DIAGNOSIS — I10 Essential (primary) hypertension: Secondary | ICD-10-CM | POA: Diagnosis not present

## 2023-06-03 DIAGNOSIS — G309 Alzheimer's disease, unspecified: Secondary | ICD-10-CM | POA: Diagnosis not present

## 2023-06-06 DIAGNOSIS — N400 Enlarged prostate without lower urinary tract symptoms: Secondary | ICD-10-CM | POA: Diagnosis not present

## 2023-06-06 DIAGNOSIS — G309 Alzheimer's disease, unspecified: Secondary | ICD-10-CM | POA: Diagnosis not present

## 2023-06-06 DIAGNOSIS — F0280 Dementia in other diseases classified elsewhere without behavioral disturbance: Secondary | ICD-10-CM | POA: Diagnosis not present

## 2023-06-06 DIAGNOSIS — R634 Abnormal weight loss: Secondary | ICD-10-CM | POA: Diagnosis not present

## 2023-06-06 DIAGNOSIS — I10 Essential (primary) hypertension: Secondary | ICD-10-CM | POA: Diagnosis not present

## 2023-06-06 DIAGNOSIS — E785 Hyperlipidemia, unspecified: Secondary | ICD-10-CM | POA: Diagnosis not present

## 2023-06-08 DIAGNOSIS — G309 Alzheimer's disease, unspecified: Secondary | ICD-10-CM | POA: Diagnosis not present

## 2023-06-08 DIAGNOSIS — F0280 Dementia in other diseases classified elsewhere without behavioral disturbance: Secondary | ICD-10-CM | POA: Diagnosis not present

## 2023-06-08 DIAGNOSIS — I10 Essential (primary) hypertension: Secondary | ICD-10-CM | POA: Diagnosis not present

## 2023-06-08 DIAGNOSIS — R634 Abnormal weight loss: Secondary | ICD-10-CM | POA: Diagnosis not present

## 2023-06-08 DIAGNOSIS — E785 Hyperlipidemia, unspecified: Secondary | ICD-10-CM | POA: Diagnosis not present

## 2023-06-08 DIAGNOSIS — N400 Enlarged prostate without lower urinary tract symptoms: Secondary | ICD-10-CM | POA: Diagnosis not present

## 2023-06-09 DIAGNOSIS — C4492 Squamous cell carcinoma of skin, unspecified: Secondary | ICD-10-CM | POA: Diagnosis not present

## 2023-06-09 DIAGNOSIS — R634 Abnormal weight loss: Secondary | ICD-10-CM | POA: Diagnosis not present

## 2023-06-09 DIAGNOSIS — I10 Essential (primary) hypertension: Secondary | ICD-10-CM | POA: Diagnosis not present

## 2023-06-09 DIAGNOSIS — N400 Enlarged prostate without lower urinary tract symptoms: Secondary | ICD-10-CM | POA: Diagnosis not present

## 2023-06-09 DIAGNOSIS — E785 Hyperlipidemia, unspecified: Secondary | ICD-10-CM | POA: Diagnosis not present

## 2023-06-09 DIAGNOSIS — G309 Alzheimer's disease, unspecified: Secondary | ICD-10-CM | POA: Diagnosis not present

## 2023-06-09 DIAGNOSIS — F0280 Dementia in other diseases classified elsewhere without behavioral disturbance: Secondary | ICD-10-CM | POA: Diagnosis not present

## 2023-06-09 DIAGNOSIS — K219 Gastro-esophageal reflux disease without esophagitis: Secondary | ICD-10-CM | POA: Diagnosis not present

## 2023-06-10 DIAGNOSIS — F0280 Dementia in other diseases classified elsewhere without behavioral disturbance: Secondary | ICD-10-CM | POA: Diagnosis not present

## 2023-06-10 DIAGNOSIS — I10 Essential (primary) hypertension: Secondary | ICD-10-CM | POA: Diagnosis not present

## 2023-06-10 DIAGNOSIS — R634 Abnormal weight loss: Secondary | ICD-10-CM | POA: Diagnosis not present

## 2023-06-10 DIAGNOSIS — N400 Enlarged prostate without lower urinary tract symptoms: Secondary | ICD-10-CM | POA: Diagnosis not present

## 2023-06-10 DIAGNOSIS — E785 Hyperlipidemia, unspecified: Secondary | ICD-10-CM | POA: Diagnosis not present

## 2023-06-10 DIAGNOSIS — G309 Alzheimer's disease, unspecified: Secondary | ICD-10-CM | POA: Diagnosis not present

## 2023-07-10 DEATH — deceased
